# Patient Record
Sex: Male | Born: 1970 | Hispanic: No | Marital: Married | Smoking: Current every day smoker
Health system: Southern US, Community
[De-identification: ages and names within clinical notes are randomized; demographics above are authoritative.]

## PROBLEM LIST (undated history)

## (undated) ENCOUNTER — Ambulatory Visit: Discharge status: Absent without leave | Payer: Self-pay

## (undated) DIAGNOSIS — A048 Other specified bacterial intestinal infections: Secondary | ICD-10-CM

## (undated) DIAGNOSIS — Z8711 Personal history of peptic ulcer disease: Secondary | ICD-10-CM

## (undated) HISTORY — PX: TRACHEOSTOMY: SUR1362

## (undated) HISTORY — PX: GASTROSTOMY W/ FEEDING TUBE: SUR642

## (undated) HISTORY — PX: OTHER SURGICAL HISTORY: SHX169

---

## 2005-09-29 ENCOUNTER — Ambulatory Visit: Payer: Self-pay | Admitting: Pulmonary Disease

## 2005-09-29 ENCOUNTER — Inpatient Hospital Stay (HOSPITAL_COMMUNITY): Admission: AC | Admit: 2005-09-29 | Discharge: 2005-10-12 | Payer: Self-pay

## 2005-10-18 ENCOUNTER — Ambulatory Visit (HOSPITAL_COMMUNITY): Admission: RE | Admit: 2005-10-18 | Discharge: 2005-10-18 | Payer: Self-pay | Admitting: General Surgery

## 2005-12-14 ENCOUNTER — Ambulatory Visit (HOSPITAL_COMMUNITY): Admission: RE | Admit: 2005-12-14 | Discharge: 2005-12-14 | Payer: Self-pay | Admitting: Otolaryngology

## 2005-12-15 ENCOUNTER — Emergency Department (HOSPITAL_COMMUNITY): Admission: EM | Admit: 2005-12-15 | Discharge: 2005-12-15 | Payer: Self-pay | Admitting: Emergency Medicine

## 2005-12-26 ENCOUNTER — Encounter: Admission: RE | Admit: 2005-12-26 | Discharge: 2006-01-29 | Payer: Self-pay | Admitting: Otolaryngology

## 2006-02-02 ENCOUNTER — Ambulatory Visit (HOSPITAL_COMMUNITY): Admission: RE | Admit: 2006-02-02 | Discharge: 2006-02-02 | Payer: Self-pay | Admitting: Otolaryngology

## 2006-09-12 ENCOUNTER — Ambulatory Visit (HOSPITAL_COMMUNITY): Admission: RE | Admit: 2006-09-12 | Discharge: 2006-09-12 | Payer: Self-pay | Admitting: Otolaryngology

## 2007-08-28 ENCOUNTER — Encounter: Admission: RE | Admit: 2007-08-28 | Discharge: 2007-09-23 | Payer: Self-pay | Admitting: Otolaryngology

## 2010-05-31 ENCOUNTER — Emergency Department (HOSPITAL_COMMUNITY)
Admission: EM | Admit: 2010-05-31 | Discharge: 2010-05-31 | Payer: Self-pay | Source: Home / Self Care | Admitting: Family Medicine
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
## 2013-04-07 ENCOUNTER — Emergency Department (INDEPENDENT_AMBULATORY_CARE_PROVIDER_SITE_OTHER): Payer: Medicare Other

## 2013-04-07 ENCOUNTER — Encounter (HOSPITAL_COMMUNITY): Payer: Self-pay | Admitting: Emergency Medicine

## 2013-04-07 ENCOUNTER — Emergency Department (INDEPENDENT_AMBULATORY_CARE_PROVIDER_SITE_OTHER)
Admission: EM | Admit: 2013-04-07 | Discharge: 2013-04-07 | Disposition: A | Discharge status: Home / Self Care | Payer: Medicare Other | Source: Home / Self Care | Attending: Emergency Medicine | Admitting: Emergency Medicine

## 2013-04-07 DIAGNOSIS — A048 Other specified bacterial intestinal infections: Secondary | ICD-10-CM

## 2013-04-07 DIAGNOSIS — R109 Unspecified abdominal pain: Secondary | ICD-10-CM

## 2013-04-07 LAB — POCT URINALYSIS DIP (DEVICE)
Bilirubin Urine: NEGATIVE
Glucose, UA: NEGATIVE mg/dL
Hgb urine dipstick: NEGATIVE
Leukocytes, UA: NEGATIVE
Protein, ur: NEGATIVE mg/dL
Specific Gravity, Urine: 1.02 (ref 1.005–1.030)
Urobilinogen, UA: 0.2 mg/dL (ref 0.0–1.0)
pH: 7.5 (ref 5.0–8.0)

## 2013-04-07 LAB — POCT H PYLORI SCREEN: H. PYLORI SCREEN, POC: POSITIVE — AB

## 2013-04-07 MED ORDER — AMOXICILLIN 500 MG PO TABS: 1000.0000 mg | ORAL_TABLET | Freq: Two times a day (BID) | ORAL | Status: DC

## 2013-04-07 MED ORDER — CLARITHROMYCIN 500 MG PO TABS: 500.0000 mg | ORAL_TABLET | Freq: Two times a day (BID) | ORAL | Status: DC

## 2013-04-07 MED ORDER — OMEPRAZOLE 20 MG PO CPDR: 20.0000 mg | DELAYED_RELEASE_CAPSULE | Freq: Two times a day (BID) | ORAL | Status: DC

## 2013-04-07 NOTE — ED Notes (Signed)
C/o 3 day duration of pain epigastric and suprapubic area ; denies n/v/d, no changes in stool, no one else in home ill. ; NAD at present

## 2013-04-07 NOTE — ED Provider Notes (Signed)
CSN: [id number]     Arrival date & time 04/07/13  1433 History   First MD Initiated Contact with Patient 04/07/13 1744     Chief Complaint  Patient presents with  . Abdominal Pain   (Consider location/radiation/quality/duration/timing/severity/associated sxs/prior Treatment) HPI Comments: Pt reports that every time he eats, he feels pain in the epigastric area, and then he feels stool traveling through his abd and then he feels pain in the suprapubic area, then he has a bowel movement and the sx are completely resolved. He also feels similar sx when he is passing gas.  If he doesn't eat, he doesn't have bowel movement, but every time he eats he has a bowel movement. The stool is sometimes loose and sometimes solid. No pain at the present.   Patient is a 42 y.o. male presenting with abdominal pain. The history is provided by the patient.  Abdominal Pain This is a new problem. Episode onset: 3 days ago. Associated symptoms include abdominal pain. The symptoms are aggravated by eating. Relieved by: bowel movement or passing gas. Treatments tried: eating only vegetables or not eating. The treatment provided moderate relief.    History reviewed. No pertinent past medical history. Past Surgical History  Procedure Laterality Date  . Gastrostomy w/ feeding tube    . Tracheostomy     History reviewed. No pertinent family history. History  Substance Use Topics  . Smoking status: Never Smoker   . Smokeless tobacco: Not on file  . Alcohol Use: No    Review of Systems  Constitutional: Negative for fever and chills.  Gastrointestinal: Positive for abdominal pain. Negative for nausea, vomiting and blood in stool.  Genitourinary: Negative for dysuria and enuresis.    Allergies  Review of patient's allergies indicates no known allergies.  Home Medications   Current Outpatient Rx  Name  Route  Sig  Dispense  Refill  . amoxicillin (AMOXIL) 500 MG tablet   Oral   Take 2 tablets (1,000 mg  total) by mouth 2 (two) times daily.   56 tablet   0   . clarithromycin (BIAXIN) 500 MG tablet   Oral   Take 1 tablet (500 mg total) by mouth 2 (two) times daily.   28 tablet   0   . omeprazole (PRILOSEC) 20 MG capsule   Oral   Take 1 capsule (20 mg total) by mouth 2 (two) times daily.   28 capsule   0    BP 123/85  Pulse 77  Temp(Src) 98.2 F (36.8 C) (Oral)  Resp 16  SpO2 96% Physical Exam  Constitutional: He appears well-developed and well-nourished. No distress.  Cardiovascular: Normal rate and regular rhythm.   Pulmonary/Chest: Effort normal and breath sounds normal.  Abdominal: Normal appearance and bowel sounds are normal. There is no tenderness. There is no rigidity, no rebound and no CVA tenderness.  Scar in epigastric area- pt reports is from gastrostomy tube.     ED Course  Procedures (including critical care time) Labs Review Labs Reviewed  POCT H PYLORI SCREEN - Abnormal; Notable for the following:    H. PYLORI SCREEN, POC POSITIVE (*)    All other components within normal limits  POCT URINALYSIS DIP (DEVICE)   Imaging Review Dg Abd Acute W/chest  04/07/2013   CLINICAL DATA:  Abdominal pain and diarrhea  EXAM: ACUTE ABDOMEN SERIES (ABDOMEN 2 VIEW & CHEST 1 VIEW)  COMPARISON:  Chest radiograph February 02, 2006  FINDINGS: PA chest: There is bibasilar atelectasis. Lungs are  otherwise clear. Heart size and pulmonary vascularity are normal. No adenopathy.  Supine and upright abdomen: There is moderate stool throughout colon. The bowel gas pattern is normal. No obstruction or free air. There are phleboliths in the pelvis.  IMPRESSION: Unremarkable bowel gas pattern. Mild bibasilar atelectasis. No edema or consolidation.   Electronically Signed   By: Bretta Bang M.D.   On: 04/07/2013 17:11    EKG Interpretation     Ventricular Rate:    PR Interval:    QRS Duration:   QT Interval:    QTC Calculation:   R Axis:     Text Interpretation:               MDM   1. Abdominal pain   2. H. pylori infection    Uncertain of exact cause of pt's sx.  Will tx for h pylori. Rx amoxicillin 500mg  2 tabs po BID #56, clarithromycin 500mg  BID #28, and omeprazole 20mg  BID #28. Pt to change diet to clear liquids for 2-3 days and then switch to brat diet for 2-3 days. If this tx plan does not improve sx, pt to f/u with [facility].     Cathlyn Parsons, NP 04/07/13 [id number]

## 2013-04-07 NOTE — ED Provider Notes (Signed)
Medical screening examination/treatment/procedure(s) were performed by non-physician practitioner and as supervising physician I was immediately available for consultation/collaboration.  Leslee Home, M.D.  Reuben Likes, MD 04/07/13 2123

## 2015-08-11 ENCOUNTER — Encounter (HOSPITAL_COMMUNITY): Payer: Self-pay | Admitting: Vascular Surgery

## 2015-08-11 ENCOUNTER — Emergency Department (HOSPITAL_COMMUNITY)
Admission: EM | Admit: 2015-08-11 | Discharge: 2015-08-11 | Disposition: A | Discharge status: Home / Self Care | Payer: Medicare Other | Attending: Emergency Medicine | Admitting: Emergency Medicine

## 2015-08-11 DIAGNOSIS — R202 Paresthesia of skin: Secondary | ICD-10-CM | POA: Insufficient documentation

## 2015-08-11 DIAGNOSIS — M542 Cervicalgia: Secondary | ICD-10-CM | POA: Diagnosis not present

## 2015-08-11 DIAGNOSIS — R200 Anesthesia of skin: Secondary | ICD-10-CM | POA: Diagnosis not present

## 2015-08-11 DIAGNOSIS — M25511 Pain in right shoulder: Secondary | ICD-10-CM | POA: Diagnosis present

## 2015-08-11 DIAGNOSIS — Z792 Long term (current) use of antibiotics: Secondary | ICD-10-CM | POA: Diagnosis not present

## 2015-08-11 DIAGNOSIS — Z8619 Personal history of other infectious and parasitic diseases: Secondary | ICD-10-CM | POA: Diagnosis not present

## 2015-08-11 DIAGNOSIS — Z79899 Other long term (current) drug therapy: Secondary | ICD-10-CM | POA: Insufficient documentation

## 2015-08-11 HISTORY — DX: Other specified bacterial intestinal infections: A04.8
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
## 2015-08-11 NOTE — Discharge Instructions (Signed)
Shoulder Pain Follow up with orthopedics. Take ibuprofen as needed for pain. You may need conservative treatment such as physical therapy or joint injection. The shoulder is the joint that connects your arm to your body. Muscles and band-like tissues that connect bones to muscles (tendons) hold the joint together. Shoulder pain is felt if an injury or medical problem affects one or more parts of the shoulder. HOME CARE   Put ice on the sore area.  Put ice in a plastic bag.  Place a towel between your skin and the bag.  Leave the ice on for 15-20 minutes, 03-04 times a day for the first 2 days.  Stop using cold packs if they do not help with the pain.  If you were given something to keep your shoulder from moving (sling; shoulder immobilizer), wear it as told. Only take it off to shower or bathe.  Move your arm as little as possible, but keep your hand moving to prevent puffiness (swelling).  Squeeze a soft ball or foam pad as much as possible to help prevent swelling.  Take medicine as told by your doctor. GET HELP IF:  You have progressing new pain in your arm, hand, or fingers.  Your hand or fingers get cold.  Your medicine does not help lessen your pain. GET HELP RIGHT AWAY IF:   Your arm, hand, or fingers are numb or tingling.  Your arm, hand, or fingers are puffy (swollen), painful, or turn white or blue. MAKE SURE YOU:   Understand these instructions.  Will watch your condition.  Will get help right away if you are not doing well or get worse.   This information is not intended to replace advice given to you by your health care provider. Make sure you discuss any questions you have with your health care provider.   Document Released: 11/29/2007 Document Revised: 07/03/2014 Document Reviewed: 10/05/2014 Elsevier Interactive Patient Education [employer]

## 2015-08-11 NOTE — ED Provider Notes (Signed)
CSN: [id number]     Arrival date & time 08/11/15  1629 History  By signing my name below, I, Linna Darner, attest that this documentation has been prepared under the direction and in the presence of non-physician practitioner, Haynes Dage, PA-C. Electronically Signed: Linna Darner, Scribe. 08/11/2015. 4:38 PM.    Chief Complaint  Patient presents with  . Shoulder Pain    The history is provided by the patient. No language interpreter was used.     HPI Comments: [name] is a 45 y.o. male who presents to the Emergency Department complaining of sudden onset, constant, right shoulder pain for the last week. Pt states that the pain occasionally radiates throughout his right arm; he also endorses intermittent tingling and numbness in his right hand. Pt endorses pain to deep palpation to back of right shoulder. He states that his pain began in his right shoulder but endorses associated occasional, moderate neck pain as well. Pt denies any known injury. Pt denies taking any medications for his pain. He has never had surgery on his right shoulder. He denies any known medical issues.  Pt denies weakness or any other associated symptoms at this time.    Past Medical History  Diagnosis Date  . H. pylori infection    Past Surgical History  Procedure Laterality Date  . Gastrostomy w/ feeding tube    . Tracheostomy    . Vocal cord repair     No family history on file. Social History  Substance Use Topics  . Smoking status: Never Smoker   . Smokeless tobacco: None  . Alcohol Use: No    Review of Systems  Musculoskeletal: Positive for arthralgias (right shoulder; right arm) and neck pain (back of neck, moderate).  Neurological: Positive for numbness (right hand). Negative for weakness.      Allergies  Review of patient's allergies indicates no known allergies.  Home Medications   Prior to Admission medications   Medication Sig Start Date End Date Taking? Authorizing  Provider  amoxicillin (AMOXIL) 500 MG tablet Take 2 tablets (1,000 mg total) by mouth 2 (two) times daily. 04/07/13   Cathlyn Parsons, NP  clarithromycin (BIAXIN) 500 MG tablet Take 1 tablet (500 mg total) by mouth 2 (two) times daily. 04/07/13   Cathlyn Parsons, NP  omeprazole (PRILOSEC) 20 MG capsule Take 1 capsule (20 mg total) by mouth 2 (two) times daily. 04/07/13   Cathlyn Parsons, NP   BP 119/80 mmHg  Pulse 86  Temp(Src) 97.9 F (36.6 C) (Oral)  Resp 16  SpO2 100% Physical Exam  Constitutional: He is oriented to person, place, and time. He appears well-developed and well-nourished. No distress.  HENT:  Head: Normocephalic and atraumatic.  Eyes: Conjunctivae and EOM are normal.  Neck: Normal range of motion. Neck supple. No spinous process tenderness present. No tracheal deviation present.  Neck is supple. Normal range of motion without pain. No spinous process tenderness.  Pain just above the right scapula but no erythema or ecchymosis.  Cardiovascular: Normal rate.   Pulmonary/Chest: Effort normal. No respiratory distress.  Musculoskeletal: Normal range of motion.  Right arm: Full range of motion without difficulty. 2+ radial pulse. Able to flex and extend all fingers. Able to flex and extend elbow. Reports shooting pain down his arm and into his fingers. No numbness at this time.  Neurological: He is alert and oriented to person, place, and time.  Skin: Skin is warm and dry.  Psychiatric: He has a normal mood  and affect. His behavior is normal.  Nursing note and vitals reviewed.   ED Course  Procedures (including critical care time)  DIAGNOSTIC STUDIES: Oxygen Saturation is 100% on RA, normal by my interpretation.    COORDINATION OF CARE: 4:38 PM Discussed treatment plan with pt at bedside and pt agreed to plan.   Labs Review Labs Reviewed - No data to display  Imaging Review No results found.    EKG Interpretation None      MDM   Final diagnoses:  Right  shoulder pain   Patient reports numbness and tingling down his right arm with pain along the scapula. He also reports a shooting pain that originates from his back to his arm. I believe this is nerve related and not an issue with the bones. No signs of infection or septic joint. He is afebrile with stable vitals. I discussed follow-up with orthopedics for possible conservative therapies. Patient refused pain medication and stated that he just wanted to make sure everything was okay. Medications - No data to display Filed Vitals:   08/11/15 1635  BP: 119/80  Pulse: 86  Temp: 97.9 F (36.6 C)  Resp: 16   I personally performed the services described in this documentation, which was scribed in my presence. The recorded information has been reviewed and is accurate.      Catha Gosselin, PA-C 08/11/15 1652  Donnetta Hutching, MD 08/11/15 2308

## 2018-06-26 ENCOUNTER — Other Ambulatory Visit: Payer: Self-pay

## 2018-06-26 ENCOUNTER — Emergency Department (HOSPITAL_COMMUNITY)
Admission: EM | Admit: 2018-06-26 | Discharge: 2018-06-26 | Disposition: A | Discharge status: Home / Self Care | Payer: Medicare Other | Attending: Emergency Medicine | Admitting: Emergency Medicine

## 2018-06-26 ENCOUNTER — Other Ambulatory Visit (HOSPITAL_COMMUNITY): Payer: Medicare Other

## 2018-06-26 ENCOUNTER — Emergency Department (HOSPITAL_COMMUNITY): Payer: Medicare Other

## 2018-06-26 DIAGNOSIS — K8020 Calculus of gallbladder without cholecystitis without obstruction: Secondary | ICD-10-CM | POA: Diagnosis not present

## 2018-06-26 DIAGNOSIS — Z79899 Other long term (current) drug therapy: Secondary | ICD-10-CM | POA: Insufficient documentation

## 2018-06-26 DIAGNOSIS — R1011 Right upper quadrant pain: Secondary | ICD-10-CM | POA: Insufficient documentation

## 2018-06-26 LAB — COMPREHENSIVE METABOLIC PANEL
ALK PHOS: 45 U/L (ref 38–126)
ALT: 34 U/L (ref 0–44)
AST: 37 U/L (ref 15–41)
Albumin: 3.8 g/dL (ref 3.5–5.0)
Anion gap: 9 (ref 5–15)
BUN: 13 mg/dL (ref 6–20)
CO2: 29 mmol/L (ref 22–32)
CREATININE: 0.96 mg/dL (ref 0.61–1.24)
Calcium: 8.7 mg/dL — ABNORMAL LOW (ref 8.9–10.3)
Chloride: 106 mmol/L (ref 98–111)
GFR calc Af Amer: >60 mL/min (ref >60)
GFR calc non Af Amer: >60 mL/min (ref >60)
Glucose, Bld: 142 mg/dL — ABNORMAL HIGH (ref 70–99)
Potassium: 4 mmol/L (ref 3.5–5.1)
Sodium: 144 mmol/L (ref 135–145)
Total Bilirubin: 1.1 mg/dL (ref 0.3–1.2)
Total Protein: 6.8 g/dL (ref 6.5–8.1)

## 2018-06-26 LAB — CBC WITH DIFFERENTIAL/PLATELET
Abs Immature Granulocytes: 0.03 10*3/uL (ref 0.00–0.07)
Basophils Absolute: 0 10*3/uL (ref 0.0–0.1)
Basophils Relative: 1 %
Eosinophils Absolute: 0.1 10*3/uL (ref 0.0–0.5)
Eosinophils Relative: 2 %
HCT: 50.8 % (ref 39.0–52.0)
HEMOGLOBIN: 16.4 g/dL (ref 13.0–17.0)
Immature Granulocytes: 1 %
LYMPHS PCT: 31 %
Lymphs Abs: 1.5 10*3/uL (ref 0.7–4.0)
MCH: 29.1 pg (ref 26.0–34.0)
MCHC: 32.3 g/dL (ref 30.0–36.0)
MCV: 90.1 fL (ref 80.0–100.0)
Monocytes Absolute: 0.6 10*3/uL (ref 0.1–1.0)
Monocytes Relative: 12 %
Neutro Abs: 2.6 10*3/uL (ref 1.7–7.7)
Neutrophils Relative %: 53 %
Platelets: 278 10*3/uL (ref 150–400)
RBC: 5.64 MIL/uL (ref 4.22–5.81)
RDW: 12.9 % (ref 11.5–15.5)
WBC: 4.8 10*3/uL (ref 4.0–10.5)
nRBC: 0 % (ref 0.0–0.2)

## 2018-06-26 LAB — URINALYSIS, ROUTINE W REFLEX MICROSCOPIC
Bilirubin Urine: NEGATIVE
Glucose, UA: NEGATIVE mg/dL
Hgb urine dipstick: NEGATIVE
Ketones, ur: NEGATIVE mg/dL
Leukocytes, UA: NEGATIVE
Nitrite: NEGATIVE
Protein, ur: NEGATIVE mg/dL
Specific Gravity, Urine: 1.031 — ABNORMAL HIGH (ref 1.005–1.030)
pH: 5 (ref 5.0–8.0)

## 2018-06-26 LAB — RAPID URINE DRUG SCREEN, HOSP PERFORMED
Amphetamines: NOT DETECTED
Barbiturates: NOT DETECTED
Benzodiazepines: NOT DETECTED
Cocaine: NOT DETECTED
Opiates: NOT DETECTED
TETRAHYDROCANNABINOL: NOT DETECTED

## 2018-06-26 LAB — LIPASE, BLOOD: Lipase: 24 U/L (ref 11–51)

## 2018-06-26 MED ORDER — HYDROCODONE-ACETAMINOPHEN 5-325 MG PO TABS: 1.0000 | ORAL_TABLET | ORAL | 0 refills | Status: DC | PRN

## 2018-06-26 MED ORDER — HYDROCODONE-ACETAMINOPHEN 5-325 MG PO TABS
1.0000 | ORAL_TABLET | Freq: Once | ORAL | Status: AC
  Administered 2018-06-26: 1 via ORAL
  Filled 2018-06-26: qty 1

## 2018-06-26 NOTE — ED Provider Notes (Signed)
[facility] EMERGENCY DEPARTMENT Provider Note   CSN: [id number] Arrival date & time: 06/26/18  0830     History   Chief Complaint Chief Complaint  Patient presents with  . Abdominal Pain    HPI [name] is a 48 y.o. male.  HPI   He reports onset of upper abdominal pain, somewhat right-sided, this morning.  He wonders if it is related to his intake of "lentils," last night.  He also feels like he has "gas."  He denies nausea, vomiting, weakness or dizziness.  There is been no fever, chest pain or shortness of breath.  No prior similar problems.  There are no modifying factors.  Past Medical History:  Diagnosis Date  . H. pylori infection     There are no active problems to display for this patient.   Past Surgical History:  Procedure Laterality Date  . GASTROSTOMY W/ FEEDING TUBE    . TRACHEOSTOMY    . vocal cord repair          Home Medications    Prior to Admission medications   Medication Sig Start Date End Date Taking? Authorizing Provider  amoxicillin (AMOXIL) 500 MG tablet Take 2 tablets (1,000 mg total) by mouth 2 (two) times daily. 04/07/13   Cathlyn ParsonsKabbe, Angela M, NP  clarithromycin (BIAXIN) 500 MG tablet Take 1 tablet (500 mg total) by mouth 2 (two) times daily. 04/07/13   Cathlyn ParsonsKabbe, Angela M, NP  HYDROcodone-acetaminophen (NORCO) 5-325 MG tablet Take 1-2 tablets by mouth every 4 (four) hours as needed for moderate pain. 06/26/18   Mancel BaleWentz, Kassidie Hendriks, MD  omeprazole (PRILOSEC) 20 MG capsule Take 1 capsule (20 mg total) by mouth 2 (two) times daily. 04/07/13   Cathlyn ParsonsKabbe, Angela M, NP    Family History No family history on file.  Social History Social History   Tobacco Use  . Smoking status: Never Smoker  Substance Use Topics  . Alcohol use: No  . Drug use: Not on file     Allergies   Patient has no known allergies.   Review of Systems Review of Systems  All other systems reviewed and are negative.    Physical Exam Updated Vital  Signs BP (!) 141/95   Pulse 87   Temp 97.7 F (36.5 C) (Oral)   Resp 16   Ht 5\' 11"  (1.803 m)   Wt 77.1 kg   SpO2 99%   BMI 23.71 kg/m   Physical Exam Vitals signs and nursing note reviewed.  Constitutional:      Appearance: He is well-developed.  HENT:     Head: Normocephalic and atraumatic.     Right Ear: External ear normal.     Left Ear: External ear normal.  Eyes:     Conjunctiva/sclera: Conjunctivae normal.     Pupils: Pupils are equal, round, and reactive to light.  Neck:     Musculoskeletal: Normal range of motion and neck supple.     Trachea: Phonation normal.  Cardiovascular:     Rate and Rhythm: Normal rate and regular rhythm.     Heart sounds: Normal heart sounds.  Pulmonary:     Effort: Pulmonary effort is normal.     Breath sounds: Normal breath sounds.  Abdominal:     Palpations: Abdomen is soft.     Tenderness: There is no abdominal tenderness.  Musculoskeletal: Normal range of motion.  Skin:    General: Skin is warm and dry.  Neurological:     Mental Status: He is  alert and oriented to person, place, and time.     Cranial Nerves: No cranial nerve deficit.     Sensory: No sensory deficit.     Motor: No abnormal muscle tone.     Coordination: Coordination normal.  Psychiatric:        Behavior: Behavior normal.        Thought Content: Thought content normal.        Judgment: Judgment normal.      ED Treatments / Results  Labs (all labs ordered are listed, but only abnormal results are displayed) Labs Reviewed  COMPREHENSIVE METABOLIC PANEL - Abnormal; Notable for the following components:      Result Value   Glucose, Bld 142 (*)    Calcium 8.7 (*)    All other components within normal limits  URINALYSIS, ROUTINE W REFLEX MICROSCOPIC - Abnormal; Notable for the following components:   APPearance HAZY (*)    Specific Gravity, Urine 1.031 (*)    All other components within normal limits  LIPASE, BLOOD  CBC WITH DIFFERENTIAL/PLATELET  RAPID  URINE DRUG SCREEN, HOSP PERFORMED    EKG None  Radiology US Abdomen Complete  Result Date: 06/26/2018 CLINICAL DATA:  Right upper quadrant abdominal pain. EXAM: ABDOMEN ULTRASOUND COMPLETE COMPARISON:  None. FINDINGS: Gallbladder: Multiple stones within the gallbladder lumen. No gallbladder wall thickening or pericholecystic fluid. Negative sonographic Murphy sign. Common bile duct: Diameter: 5 mm Liver: Increased in echogenicity. No focal lesion identified. Portal vein is patent on color Doppler imaging with normal direction of blood flow towards the liver. IVC: No abnormality visualized. Pancreas: Visualized portion unremarkable. Spleen: Size and appearance within normal limits. Right Kidney: Length: 9.9 cm. Increased cortical echogenicity. No hydronephrosis. Left Kidney: Length: 10.6 cm. Increased cortical echogenicity. No hydronephrosis. Abdominal aorta: No aneurysm visualized. Other findings: None. IMPRESSION: 1. Cholelithiasis without sonographic evidence for acute cholecystitis. 2. Hepatic steatosis. 3. Echogenic kidneys suggestive of chronic medical renal disease. Electronically Signed   By: Annia Belt M.D.   On: 06/26/2018 10:46    Procedures Procedures (including critical care time)  Medications Ordered in ED Medications  HYDROcodone-acetaminophen (NORCO/VICODIN) 5-325 MG per tablet 1 tablet (has no administration in time range)     Initial Impression / Assessment and Plan / ED Course  I have reviewed the triage vital signs and the nursing notes.  Pertinent labs & imaging results that were available during my care of the patient were reviewed by me and considered in my medical decision making (see chart for details).  Clinical Course as of Jun 26 1209  Wed Jun 26, 2018  1105 Normal  Urine rapid drug screen (hosp performed) [EW]  1105 Normal except glucose high, calcium low  Comprehensive metabolic panel(!) [EW]  1105 Normal   [EW]  1105 Normal except specific gravity high    Urinalysis, Routine w reflex microscopic(!) [EW]  1105 Gallstones without cholecystitis.  US Abdomen Complete [EW]    Clinical Course User Index [EW] Mancel Bale, MD     Patient Vitals for the past 24 hrs:  BP Temp Temp src Pulse Resp SpO2 Height Weight  06/26/18 1130 (!) 141/95 - - 87 16 99 % - -  06/26/18 1052 114/74 - - 67 18 100 % - -  06/26/18 0945 (!) 137/97 - - 71 - 100 % - -  06/26/18 0930 (!) 127/93 - - 86 - 98 % - -  06/26/18 0900 111/82 - - 81 - 100 % - -  06/26/18 0845 125/84 - -  80 - 98 % - -  06/26/18 0842 124/89 97.7 F (36.5 C) Oral 71 16 97 % - -  06/26/18 0839 - - - - - - 5\' 11"  (1.803 m) 77.1 kg    12:03 AM Reevaluation with update and discussion. After initial assessment and treatment, an updated evaluation reveals he is somewhat uncomfortable at this time and requested a pain pill.  Findings discussed with the patient and his wife, all questions were answered. Mancel Bale   Medical Decision Making: Abdominal pain with gallstones present.  Suspect gallbladder colic causing discomfort.  No evidence for cholecystitis.  Patient can be managed as an outpatient pending surgical consultation.  Doubt serious bacterial infection, metabolic instability or impending vascular collapse.  CRITICAL CARE-no Performed by: Mancel Bale  Nursing Notes Reviewed/ Care Coordinated Applicable Imaging Reviewed Interpretation of Laboratory Data incorporated into ED treatment  The patient appears reasonably screened and/or stabilized for discharge and I doubt any other medical condition or other [facility] requiring further screening, evaluation, or treatment in the ED at this time prior to discharge.  Plan: Home Medications-OTC analgesia of choice; Home Treatments-low-fat diet; return here if the recommended treatment, does not improve the symptoms; Recommended follow up-General surgery follow-up regarding cholecystectomy if needed.    Final Clinical Impressions(s) / ED Diagnoses    Final diagnoses:  Right upper quadrant abdominal pain  Gall bladder stones    ED Discharge Orders         Ordered    HYDROcodone-acetaminophen (NORCO) 5-325 MG tablet  Every 4 hours PRN     06/26/18 1208           Mancel Bale, MD 06/26/18 1211

## 2018-06-26 NOTE — ED Triage Notes (Signed)
Pt endorses RUQ abdominal pain starting this am at 0500. Denies N/V/D. Sts he feels like he needs to burp.

## 2018-06-26 NOTE — Discharge Instructions (Addendum)
The pain which you are having seems to be caused by the stones in your gallbladder.  To help minimize discomfort, try to avoid all forms of fat.  See the attached directions regarding low-fat diet.  If you continue to have discomfort you will likely need to have your gallbladder removed.  We are referring you to a surgeon to see for that.  Call them and schedule appointment if you continue to have pain.  Return here if you cannot control your pain with the medicines given, and a low-fat diet.  You can also try using Tylenol or Motrin for pain.

## 2018-09-30 ENCOUNTER — Other Ambulatory Visit: Payer: Self-pay

## 2018-09-30 ENCOUNTER — Encounter (HOSPITAL_COMMUNITY): Payer: Self-pay

## 2018-09-30 ENCOUNTER — Inpatient Hospital Stay (HOSPITAL_COMMUNITY)
Admission: EM | Admit: 2018-09-30 | Discharge: 2018-10-02 | DRG: 418 | Disposition: A | Discharge status: Home / Self Care | Payer: Medicare Other | Attending: General Surgery | Admitting: General Surgery

## 2018-09-30 ENCOUNTER — Emergency Department (HOSPITAL_COMMUNITY): Payer: Medicare Other

## 2018-09-30 DIAGNOSIS — K8012 Calculus of gallbladder with acute and chronic cholecystitis without obstruction: Secondary | ICD-10-CM | POA: Diagnosis present

## 2018-09-30 DIAGNOSIS — R7989 Other specified abnormal findings of blood chemistry: Secondary | ICD-10-CM | POA: Diagnosis present

## 2018-09-30 DIAGNOSIS — K8021 Calculus of gallbladder without cholecystitis with obstruction: Secondary | ICD-10-CM

## 2018-09-30 DIAGNOSIS — J3801 Paralysis of vocal cords and larynx, unilateral: Secondary | ICD-10-CM | POA: Diagnosis present

## 2018-09-30 DIAGNOSIS — K851 Biliary acute pancreatitis without necrosis or infection: Principal | ICD-10-CM | POA: Diagnosis present

## 2018-09-30 DIAGNOSIS — R1011 Right upper quadrant pain: Secondary | ICD-10-CM

## 2018-09-30 LAB — CBC WITH DIFFERENTIAL/PLATELET
Abs Immature Granulocytes: 0.02 10*3/uL (ref 0.00–0.07)
Basophils Absolute: 0 10*3/uL (ref 0.0–0.1)
Basophils Relative: 0 %
Eosinophils Absolute: 0 10*3/uL (ref 0.0–0.5)
Eosinophils Relative: 1 %
HCT: 48 % (ref 39.0–52.0)
Hemoglobin: 15.7 g/dL (ref 13.0–17.0)
Immature Granulocytes: 0 %
Lymphocytes Relative: 15 %
Lymphs Abs: 0.7 10*3/uL (ref 0.7–4.0)
MCH: 28.4 pg (ref 26.0–34.0)
MCHC: 32.7 g/dL (ref 30.0–36.0)
MCV: 87 fL (ref 80.0–100.0)
Monocytes Absolute: 0.5 10*3/uL (ref 0.1–1.0)
Monocytes Relative: 12 %
Neutro Abs: 3.2 10*3/uL (ref 1.7–7.7)
Neutrophils Relative %: 72 %
Platelets: 241 10*3/uL (ref 150–400)
RBC: 5.52 MIL/uL (ref 4.22–5.81)
RDW: 12.8 % (ref 11.5–15.5)
WBC: 4.5 10*3/uL (ref 4.0–10.5)
nRBC: 0 % (ref 0.0–0.2)

## 2018-09-30 LAB — CBC
HCT: 45.8 % (ref 39.0–52.0)
Hemoglobin: 15.7 g/dL (ref 13.0–17.0)
MCH: 30.2 pg (ref 26.0–34.0)
MCHC: 34.3 g/dL (ref 30.0–36.0)
MCV: 88.1 fL (ref 80.0–100.0)
Platelets: 280 10*3/uL (ref 150–400)
RBC: 5.2 MIL/uL (ref 4.22–5.81)
RDW: 13.1 % (ref 11.5–15.5)
WBC: 5.6 10*3/uL (ref 4.0–10.5)
nRBC: 0 % (ref 0.0–0.2)

## 2018-09-30 LAB — COMPREHENSIVE METABOLIC PANEL
ALT: 479 U/L — ABNORMAL HIGH (ref 0–44)
AST: 291 U/L — ABNORMAL HIGH (ref 15–41)
Albumin: 3.7 g/dL (ref 3.5–5.0)
Alkaline Phosphatase: 101 U/L (ref 38–126)
Anion gap: 11 (ref 5–15)
BUN: 9 mg/dL (ref 6–20)
CO2: 23 mmol/L (ref 22–32)
Calcium: 9 mg/dL (ref 8.9–10.3)
Chloride: 101 mmol/L (ref 98–111)
Creatinine, Ser: 0.81 mg/dL (ref 0.61–1.24)
GFR calc Af Amer: >60 mL/min (ref >60)
GFR calc non Af Amer: >60 mL/min (ref >60)
Glucose, Bld: 82 mg/dL (ref 70–99)
Potassium: 3.6 mmol/L (ref 3.5–5.1)
Sodium: 135 mmol/L (ref 135–145)
Total Bilirubin: 6 mg/dL — ABNORMAL HIGH (ref 0.3–1.2)
Total Protein: 6.7 g/dL (ref 6.5–8.1)

## 2018-09-30 LAB — LIPASE, BLOOD: Lipase: 676 U/L — ABNORMAL HIGH (ref 11–51)

## 2018-09-30 LAB — TRIGLYCERIDES: Triglycerides: 62 mg/dL (ref <150)

## 2018-09-30 LAB — CREATININE, SERUM
Creatinine, Ser: 0.83 mg/dL (ref 0.61–1.24)
GFR calc Af Amer: >60 mL/min (ref >60)
GFR calc non Af Amer: >60 mL/min (ref >60)

## 2018-09-30 MED ORDER — KETOROLAC TROMETHAMINE 30 MG/ML IJ SOLN: 30.0000 mg | Freq: Four times a day (QID) | INTRAMUSCULAR | Status: DC | PRN

## 2018-09-30 MED ORDER — ONDANSETRON HCL 4 MG/2ML IJ SOLN: 4.0000 mg | Freq: Four times a day (QID) | INTRAMUSCULAR | Status: DC | PRN

## 2018-09-30 MED ORDER — ACETAMINOPHEN 325 MG PO TABS
650.0000 mg | ORAL_TABLET | Freq: Four times a day (QID) | ORAL | Status: DC | PRN
  Administered 2018-10-01: 650 mg via ORAL
  Filled 2018-09-30: qty 2

## 2018-09-30 MED ORDER — ENOXAPARIN SODIUM 40 MG/0.4ML ~~LOC~~ SOLN
40.0000 mg | SUBCUTANEOUS | Status: DC
  Administered 2018-09-30 – 2018-10-01 (×2): 40 mg via SUBCUTANEOUS
  Filled 2018-09-30 (×2): qty 0.4

## 2018-09-30 MED ORDER — SODIUM CHLORIDE 0.9 % IV SOLN
Freq: Once | INTRAVENOUS | Status: AC
  Administered 2018-09-30: 100 mL/h via INTRAVENOUS

## 2018-09-30 MED ORDER — MORPHINE SULFATE (PF) 2 MG/ML IV SOLN: 1.0000 mg | INTRAVENOUS | Status: DC | PRN

## 2018-09-30 MED ORDER — DIPHENHYDRAMINE HCL 50 MG/ML IJ SOLN: 25.0000 mg | Freq: Four times a day (QID) | INTRAMUSCULAR | Status: DC | PRN

## 2018-09-30 MED ORDER — ONDANSETRON 4 MG PO TBDP: 4.0000 mg | ORAL_TABLET | Freq: Four times a day (QID) | ORAL | Status: DC | PRN

## 2018-09-30 MED ORDER — DIPHENHYDRAMINE HCL 25 MG PO CAPS: 25.0000 mg | ORAL_CAPSULE | Freq: Four times a day (QID) | ORAL | Status: DC | PRN

## 2018-09-30 MED ORDER — ACETAMINOPHEN 500 MG PO TABS
1000.0000 mg | ORAL_TABLET | Freq: Once | ORAL | Status: AC
  Administered 2018-09-30: 1000 mg via ORAL
  Filled 2018-09-30: qty 2

## 2018-09-30 MED ORDER — DOCUSATE SODIUM 100 MG PO CAPS
100.0000 mg | ORAL_CAPSULE | Freq: Two times a day (BID) | ORAL | Status: DC
  Administered 2018-09-30 – 2018-10-02 (×3): 100 mg via ORAL
  Filled 2018-09-30 (×3): qty 1

## 2018-09-30 MED ORDER — METOPROLOL TARTRATE 5 MG/5ML IV SOLN: 5.0000 mg | Freq: Four times a day (QID) | INTRAVENOUS | Status: DC | PRN

## 2018-09-30 MED ORDER — PANTOPRAZOLE SODIUM 40 MG IV SOLR
40.0000 mg | Freq: Every day | INTRAVENOUS | Status: DC
  Administered 2018-09-30 – 2018-10-01 (×2): 40 mg via INTRAVENOUS
  Filled 2018-09-30 (×2): qty 40

## 2018-09-30 MED ORDER — ACETAMINOPHEN 650 MG RE SUPP: 650.0000 mg | Freq: Four times a day (QID) | RECTAL | Status: DC | PRN

## 2018-09-30 MED ORDER — POTASSIUM CHLORIDE IN NACL 20-0.45 MEQ/L-% IV SOLN
INTRAVENOUS | Status: DC
  Administered 2018-09-30 – 2018-10-01 (×2): via INTRAVENOUS
  Filled 2018-09-30 (×3): qty 1000

## 2018-09-30 MED ORDER — OXYCODONE HCL 5 MG PO TABS
5.0000 mg | ORAL_TABLET | ORAL | Status: DC | PRN
  Administered 2018-10-01: 10 mg via ORAL
  Filled 2018-09-30: qty 2

## 2018-09-30 MED ORDER — SODIUM CHLORIDE 0.9 % IV SOLN
2.0000 g | Freq: Once | INTRAVENOUS | Status: AC
  Administered 2018-09-30: 2 g via INTRAVENOUS
  Filled 2018-09-30: qty 20

## 2018-09-30 NOTE — ED Triage Notes (Signed)
Pt here for RUQ pain fot the past 2 days, pt was here 3 months ago with similar symptoms and they said he has gallbladder problems. Denies n/v/d. Pt a.o, nad noted.

## 2018-09-30 NOTE — ED Notes (Signed)
ED TO INPATIENT HANDOFF REPORT  ED Nurse Name and Phone #: Aldean Jewett [phone]  S Name/Age/Gender [name] 48 y.o. male Room/Bed: 034C/034C  Code Status   Code Status: Full Code  Home/SNF/Other Home Patient oriented to: self, place, time and situation Is this baseline? Yes   Triage Complete: Triage complete  Chief Complaint abd pain  Triage Note Pt here for RUQ pain fot the past 2 days, pt was here 3 months ago with similar symptoms and they said he has gallbladder problems. Denies n/v/d. Pt a.o, nad noted.    Allergies No Known Allergies  Level of Care/Admitting Diagnosis ED Disposition    ED Disposition Condition Comment   Admit  Hospital Area: [facility] [[id number]]  Level of Care: Med-Surg [16]  Diagnosis: Gallstone pancreatitis [606770]  Admitting Physician: Andrey Campanile, ERIC [[id number]]  Attending Physician: CCS, MD [[id number]]  Estimated length of stay: past midnight tomorrow  Certification:: I certify this patient will need inpatient services for at least 2 midnights  Bed request comments: 6N  PT Class (Do Not Modify): Inpatient [101]  PT Acc Code (Do Not Modify): Private [1]       B Medical/Surgery History Past Medical History:  Diagnosis Date  . H. pylori infection    Past Surgical History:  Procedure Laterality Date  . GASTROSTOMY W/ FEEDING TUBE    . TRACHEOSTOMY    . vocal cord repair       A IV Location/Drains/Wounds Patient Lines/Drains/Airways Status   Active Line/Drains/Airways    Name:   Placement date:   Placement time:   Site:   Days:   Peripheral IV 09/30/18 Left Antecubital   09/30/18    1401    Antecubital   less than 1          Intake/Output Last 24 hours No intake or output data in the 24 hours ending 09/30/18 1504  Labs/Imaging Results for orders placed or performed during the hospital encounter of 09/30/18 (from the past 48 hour(s))  CBC with Differential     Status: None   Collection Time: 09/30/18 12:54 PM   Result Value Ref Range   WBC 4.5 4.0 - 10.5 K/uL   RBC 5.52 4.22 - 5.81 MIL/uL   Hemoglobin 15.7 13.0 - 17.0 g/dL   HCT 34.0 35.2 - 48.1 %   MCV 87.0 80.0 - 100.0 fL   MCH 28.4 26.0 - 34.0 pg   MCHC 32.7 30.0 - 36.0 g/dL   RDW 85.9 09.3 - 11.2 %   Platelets 241 150 - 400 K/uL   nRBC 0.0 0.0 - 0.2 %   Neutrophils Relative % 72 %   Neutro Abs 3.2 1.7 - 7.7 K/uL   Lymphocytes Relative 15 %   Lymphs Abs 0.7 0.7 - 4.0 K/uL   Monocytes Relative 12 %   Monocytes Absolute 0.5 0.1 - 1.0 K/uL   Eosinophils Relative 1 %   Eosinophils Absolute 0.0 0.0 - 0.5 K/uL   Basophils Relative 0 %   Basophils Absolute 0.0 0.0 - 0.1 K/uL   Immature Granulocytes 0 %   Abs Immature Granulocytes 0.02 0.00 - 0.07 K/uL    Comment: Performed at [facility], [address] [place], [place] [address]  Comprehensive metabolic panel     Status: Abnormal   Collection Time: 09/30/18 12:54 PM  Result Value Ref Range   Sodium 135 135 - 145 mmol/L   Potassium 3.6 3.5 - 5.1 mmol/L   Chloride 101 98 - 111  mmol/L   CO2 23 22 - 32 mmol/L   Glucose, Bld 82 70 - 99 mg/dL   BUN 9 6 - 20 mg/dL   Creatinine, Ser 4.090.81 0.61 - 1.24 mg/dL   Calcium 9.0 8.9 - 81.110.3 mg/dL   Total Protein 6.7 6.5 - 8.1 g/dL   Albumin 3.7 3.5 - 5.0 g/dL   AST 914291 (H) 15 - 41 U/L   ALT 479 (H) 0 - 44 U/L   Alkaline Phosphatase 101 38 - 126 U/L   Total Bilirubin 6.0 (H) 0.3 - 1.2 mg/dL   GFR calc non Af Amer >60 >60 mL/min   GFR calc Af Amer >60 >60 mL/min   Anion gap 11 5 - 15    Comment: Performed at [facility], [address] [place], [place] [address]  Lipase, blood     Status: Abnormal   Collection Time: 09/30/18 12:54 PM  Result Value Ref Range   Lipase 676 (H) 11 - 51 U/L    Comment: RESULTS CONFIRMED BY MANUAL DILUTION Performed at [facility], [address] [place], [place] [address]    [place] Abdomen Limited Ruq  Result Date: 09/30/2018 CLINICAL DATA:  Right upper abdominal pain EXAM: ULTRASOUND ABDOMEN  LIMITED RIGHT UPPER QUADRANT COMPARISON:  June 26, 2018 FINDINGS: Gallbladder: Within the gallbladder, there are multiple echogenic foci which move and shadow consistent with cholelithiasis. Largest individual gallstone measures 9 mm in length. There is no gallbladder wall thickening or pericholecystic fluid. No sonographic Murphy sign noted by sonographer. Common bile duct: Diameter: 4 mm. No intrahepatic or extrahepatic biliary duct dilatation. Liver: No focal lesion identified. Within normal limits in parenchymal echogenicity. Portal vein is patent on color Doppler imaging with normal direction of blood flow towards the liver. IMPRESSION: Cholelithiasis. No gallbladder wall thickening or pericholecystic fluid. Study otherwise unremarkable. Electronically Signed   By: Bretta BangWilliam  Woodruff III M.D.   On: 09/30/2018 13:30    Pending Labs Unresulted Labs (From admission, onward)    Start     Ordered   10/07/18 0500  Creatinine, serum  (enoxaparin (LOVENOX)    CrCl >/= 30 ml/min)  Weekly,   R    Comments:  while on enoxaparin therapy    09/30/18 1452   10/01/18 0500  Comprehensive metabolic panel  Tomorrow morning,   R     09/30/18 1452   10/01/18 0500  CBC  Tomorrow morning,   R     09/30/18 1452   10/01/18 0500  Lipase, blood  Tomorrow morning,   R     09/30/18 1452   09/30/18 1449  HIV antibody (Routine Testing)  Once,   R     09/30/18 1452   09/30/18 1449  CBC  (enoxaparin (LOVENOX)    CrCl >/= 30 ml/min)  Once,   R    Comments:  Baseline for enoxaparin therapy IF NOT ALREADY DRAWN.  Notify MD if PLT < 100 K.    09/30/18 1452   09/30/18 1449  Creatinine, serum  (enoxaparin (LOVENOX)    CrCl >/= 30 ml/min)  Once,   R    Comments:  Baseline for enoxaparin therapy IF NOT ALREADY DRAWN.    09/30/18 1452   09/30/18 1349  Urinalysis, Routine w reflex microscopic  Once,   R     09/30/18 1348          Vitals/Pain Today's Vitals   09/30/18 1500 09/30/18 1502 09/30/18 1502 09/30/18 1503   BP: 128/84  Pulse: (!) 106     Resp: 16     Temp:      TempSrc:      SpO2: 96%     Weight:    77.1 kg  Height:    5\' 9"  (1.753 m)  PainSc:  0-No pain 0-No pain     Isolation Precautions No active isolations  Medications Medications  enoxaparin (LOVENOX) injection 40 mg (has no administration in time range)  0.45 % NaCl with KCl 20 mEq / L infusion (has no administration in time range)  ketorolac (TORADOL) 30 MG/ML injection 30 mg (has no administration in time range)  acetaminophen (TYLENOL) tablet 650 mg (has no administration in time range)    Or  acetaminophen (TYLENOL) suppository 650 mg (has no administration in time range)  oxyCODONE (Oxy IR/ROXICODONE) immediate release tablet 5-10 mg (has no administration in time range)  morphine 2 MG/ML injection 1-2 mg (has no administration in time range)  diphenhydrAMINE (BENADRYL) capsule 25 mg (has no administration in time range)    Or  diphenhydrAMINE (BENADRYL) injection 25 mg (has no administration in time range)  docusate sodium (COLACE) capsule 100 mg (has no administration in time range)  ondansetron (ZOFRAN-ODT) disintegrating tablet 4 mg (has no administration in time range)    Or  ondansetron (ZOFRAN) injection 4 mg (has no administration in time range)  pantoprazole (PROTONIX) injection 40 mg (has no administration in time range)  metoprolol tartrate (LOPRESSOR) injection 5 mg (has no administration in time range)  acetaminophen (TYLENOL) tablet 1,000 mg (1,000 mg Oral Given 09/30/18 1355)  0.9 %  sodium chloride infusion (100 mL/hr Intravenous New Bag/Given 09/30/18 1412)  cefTRIAXone (ROCEPHIN) 2 g in sodium chloride 0.9 % 100 mL IVPB (0 g Intravenous Stopped 09/30/18 1501)    Mobility walks Low fall risk   Focused Assessments gastro   R Recommendations: See Admitting Provider Note  Report given to:   Additional Notes:

## 2018-09-30 NOTE — ED Provider Notes (Signed)
[facility] EMERGENCY DEPARTMENT Provider Note   CSN: [id number] Arrival date & time: 09/30/18  1226    History   Chief Complaint Chief Complaint  Patient presents with  . Abdominal Pain    HPI [name] is a 48 y.o. male.     The history is provided by the patient.  Abdominal Pain  Pain location:  RUQ and epigastric Pain quality: aching, gnawing and squeezing   Pain radiates to:  R shoulder and back Pain severity:  Moderate Onset quality:  Gradual Duration:  4 days Timing:  Intermittent Progression:  Worsening Chronicity:  Recurrent Context: diet changes   Context: not alcohol use and not previous surgeries   Relieved by:  OTC medications Worsened by:  Movement, palpation and eating Associated symptoms: nausea   Associated symptoms: no chest pain, no chills, no cough, no diarrhea, no dysuria, no fatigue, no fever, no shortness of breath and no vomiting   Risk factors: no alcohol abuse     Past Medical History:  Diagnosis Date  . H. pylori infection     There are no active problems to display for this patient.   Past Surgical History:  Procedure Laterality Date  . GASTROSTOMY W/ FEEDING TUBE    . TRACHEOSTOMY    . vocal cord repair          Home Medications    Prior to Admission medications   Medication Sig Start Date End Date Taking? Authorizing Provider  amoxicillin (AMOXIL) 500 MG tablet Take 2 tablets (1,000 mg total) by mouth 2 (two) times daily. 04/07/13   Cathlyn Parsons, NP  clarithromycin (BIAXIN) 500 MG tablet Take 1 tablet (500 mg total) by mouth 2 (two) times daily. 04/07/13   Cathlyn Parsons, NP  HYDROcodone-acetaminophen (NORCO) 5-325 MG tablet Take 1-2 tablets by mouth every 4 (four) hours as needed for moderate pain. 06/26/18   Mancel Bale, MD  omeprazole (PRILOSEC) 20 MG capsule Take 1 capsule (20 mg total) by mouth 2 (two) times daily. 04/07/13   Cathlyn Parsons, NP    Family History No family history on  file.  Social History Social History   Tobacco Use  . Smoking status: Never Smoker  Substance Use Topics  . Alcohol use: No  . Drug use: Not on file     Allergies   Patient has no known allergies.   Review of Systems Review of Systems  Constitutional: Negative for chills, fatigue and fever.  HENT: Negative for congestion and rhinorrhea.   Eyes: Negative for visual disturbance.  Respiratory: Negative for cough, shortness of breath and wheezing.   Cardiovascular: Negative for chest pain and leg swelling.  Gastrointestinal: Positive for abdominal pain and nausea. Negative for diarrhea and vomiting.  Genitourinary: Negative for dysuria and flank pain.  Musculoskeletal: Negative for neck pain and neck stiffness.  Skin: Negative for rash and wound.  Allergic/Immunologic: Negative for immunocompromised state.  Neurological: Negative for syncope, weakness and headaches.     Physical Exam Updated Vital Signs BP 134/86 (BP Location: Right Arm)   Pulse 99   Temp 99 F (37.2 C) (Oral)   Resp (!) 26   SpO2 95%   Physical Exam Vitals signs and nursing note reviewed.  Constitutional:      General: He is not in acute distress.    Appearance: He is well-developed.  HENT:     Head: Normocephalic and atraumatic.  Eyes:     Conjunctiva/sclera: Conjunctivae normal.  Neck:  Musculoskeletal: Neck supple.  Cardiovascular:     Rate and Rhythm: Normal rate and regular rhythm.     Heart sounds: Normal heart sounds. No murmur. No friction rub.  Pulmonary:     Effort: Pulmonary effort is normal. No respiratory distress.     Breath sounds: Normal breath sounds. No wheezing or rales.  Abdominal:     General: There is no distension.     Palpations: Abdomen is soft.     Tenderness: There is abdominal tenderness in the right upper quadrant. There is no guarding or rebound. Negative signs include Murphy's sign.  Skin:    General: Skin is warm.     Capillary Refill: Capillary refill  takes less than 2 seconds.  Neurological:     Mental Status: He is alert and oriented to person, place, and time.     Motor: No abnormal muscle tone.      ED Treatments / Results  Labs (all labs ordered are listed, but only abnormal results are displayed) Labs Reviewed  COMPREHENSIVE METABOLIC PANEL - Abnormal; Notable for the following components:      Result Value   AST 291 (*)    ALT 479 (*)    Total Bilirubin 6.0 (*)    All other components within normal limits  CBC WITH DIFFERENTIAL/PLATELET  LIPASE, BLOOD  URINALYSIS, ROUTINE W REFLEX MICROSCOPIC    EKG None  Radiology US Abdomen Limited Ruq  Result Date: 09/30/2018 CLINICAL DATA:  Right upper abdominal pain EXAM: ULTRASOUND ABDOMEN LIMITED RIGHT UPPER QUADRANT COMPARISON:  June 26, 2018 FINDINGS: Gallbladder: Within the gallbladder, there are multiple echogenic foci which move and shadow consistent with cholelithiasis. Largest individual gallstone measures 9 mm in length. There is no gallbladder wall thickening or pericholecystic fluid. No sonographic Murphy sign noted by sonographer. Common bile duct: Diameter: 4 mm. No intrahepatic or extrahepatic biliary duct dilatation. Liver: No focal lesion identified. Within normal limits in parenchymal echogenicity. Portal vein is patent on color Doppler imaging with normal direction of blood flow towards the liver. IMPRESSION: Cholelithiasis. No gallbladder wall thickening or pericholecystic fluid. Study otherwise unremarkable. Electronically Signed   By: Bretta Bang III M.D.   On: 09/30/2018 13:30    Procedures Procedures (including critical care time)  Medications Ordered in ED Medications  0.9 %  sodium chloride infusion (has no administration in time range)  cefTRIAXone (ROCEPHIN) 2 g in sodium chloride 0.9 % 100 mL IVPB (has no administration in time range)  acetaminophen (TYLENOL) tablet 1,000 mg (1,000 mg Oral Given 09/30/18 1355)     Initial Impression /  Assessment and Plan / ED Course  I have reviewed the triage vital signs and the nursing notes.  Pertinent labs & imaging results that were available during my care of the patient were reviewed by me and considered in my medical decision making (see chart for details).        48 yo m with h/o gallstones here w/ RUQ pain. History, exam is c/w recurrent symptomatic stones. Though pain improved now and WBC normal, LFTs, bIli significantly elevated. U/S shows no evidence of cholecystitis but is c/f cholelithiasis. Will d/w surgery. Rocephin, NPO, IVF.  Final Clinical Impressions(s) / ED Diagnoses   Final diagnoses:  RUQ pain  Calculus of gallbladder with biliary obstruction but without cholecystitis    ED Discharge Orders    None       Shaune Pollack, MD 09/30/18 1357
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
## 2018-10-01 ENCOUNTER — Inpatient Hospital Stay (HOSPITAL_COMMUNITY): Payer: Medicare Other | Admitting: Anesthesiology

## 2018-10-01 ENCOUNTER — Inpatient Hospital Stay (HOSPITAL_COMMUNITY): Payer: Medicare Other

## 2018-10-01 ENCOUNTER — Encounter (HOSPITAL_COMMUNITY): Admission: EM | Disposition: A | Discharge status: Home / Self Care | Payer: Self-pay | Source: Home / Self Care

## 2018-10-01 HISTORY — PX: CHOLECYSTECTOMY: SHX55

## 2018-10-01 LAB — HIV ANTIBODY (ROUTINE TESTING W REFLEX): HIV Screen 4th Generation wRfx: NONREACTIVE

## 2018-10-01 LAB — COMPREHENSIVE METABOLIC PANEL
ALT: 439 U/L — ABNORMAL HIGH (ref 0–44)
AST: 214 U/L — ABNORMAL HIGH (ref 15–41)
Albumin: 3.7 g/dL (ref 3.5–5.0)
Alkaline Phosphatase: 111 U/L (ref 38–126)
Anion gap: 11 (ref 5–15)
BUN: 8 mg/dL (ref 6–20)
CO2: 22 mmol/L (ref 22–32)
Calcium: 8.9 mg/dL (ref 8.9–10.3)
Chloride: 102 mmol/L (ref 98–111)
Creatinine, Ser: 0.93 mg/dL (ref 0.61–1.24)
GFR calc Af Amer: >60 mL/min (ref >60)
GFR calc non Af Amer: >60 mL/min (ref >60)
Glucose, Bld: 58 mg/dL — ABNORMAL LOW (ref 70–99)
Potassium: 3.6 mmol/L (ref 3.5–5.1)
Sodium: 135 mmol/L (ref 135–145)
Total Bilirubin: 3.4 mg/dL — ABNORMAL HIGH (ref 0.3–1.2)
Total Protein: 7 g/dL (ref 6.5–8.1)

## 2018-10-01 LAB — CBC
HCT: 47 % (ref 39.0–52.0)
Hemoglobin: 15.3 g/dL (ref 13.0–17.0)
MCH: 28.7 pg (ref 26.0–34.0)
MCHC: 32.6 g/dL (ref 30.0–36.0)
MCV: 88 fL (ref 80.0–100.0)
Platelets: 252 10*3/uL (ref 150–400)
RBC: 5.34 MIL/uL (ref 4.22–5.81)
RDW: 13.1 % (ref 11.5–15.5)
WBC: 5.8 10*3/uL (ref 4.0–10.5)
nRBC: 0 % (ref 0.0–0.2)

## 2018-10-01 LAB — LIPASE, BLOOD: Lipase: 95 U/L — ABNORMAL HIGH (ref 11–51)

## 2018-10-01 SURGERY — LAPAROSCOPIC CHOLECYSTECTOMY WITH INTRAOPERATIVE CHOLANGIOGRAM
Anesthesia: General | Site: Abdomen

## 2018-10-01 MED ORDER — MIDAZOLAM HCL 2 MG/2ML IJ SOLN
INTRAMUSCULAR | Status: AC
  Filled 2018-10-01: qty 2

## 2018-10-01 MED ORDER — PHENYLEPHRINE 40 MCG/ML (10ML) SYRINGE FOR IV PUSH (FOR BLOOD PRESSURE SUPPORT)
PREFILLED_SYRINGE | INTRAVENOUS | Status: AC
  Filled 2018-10-01: qty 10

## 2018-10-01 MED ORDER — SUCCINYLCHOLINE CHLORIDE 200 MG/10ML IV SOSY
PREFILLED_SYRINGE | INTRAVENOUS | Status: AC
  Filled 2018-10-01: qty 10

## 2018-10-01 MED ORDER — LACTATED RINGERS IV SOLN
INTRAVENOUS | Status: DC | PRN
  Administered 2018-10-01 (×2): via INTRAVENOUS

## 2018-10-01 MED ORDER — PROPOFOL 10 MG/ML IV BOLUS
INTRAVENOUS | Status: DC | PRN
  Administered 2018-10-01: 150 mg via INTRAVENOUS

## 2018-10-01 MED ORDER — ACETAMINOPHEN 500 MG PO TABS: 1000.0000 mg | ORAL_TABLET | Freq: Once | ORAL | Status: DC | PRN

## 2018-10-01 MED ORDER — GABAPENTIN 100 MG PO CAPS
200.0000 mg | ORAL_CAPSULE | Freq: Two times a day (BID) | ORAL | Status: DC
  Administered 2018-10-01 (×2): 200 mg via ORAL
  Filled 2018-10-01 (×3): qty 2

## 2018-10-01 MED ORDER — HEMOSTATIC AGENTS (NO CHARGE) OPTIME
TOPICAL | Status: DC | PRN
  Administered 2018-10-01: 1 via TOPICAL

## 2018-10-01 MED ORDER — DEXAMETHASONE SODIUM PHOSPHATE 10 MG/ML IJ SOLN
INTRAMUSCULAR | Status: AC
  Filled 2018-10-01: qty 1

## 2018-10-01 MED ORDER — ROCURONIUM BROMIDE 50 MG/5ML IV SOSY
PREFILLED_SYRINGE | INTRAVENOUS | Status: AC
  Filled 2018-10-01: qty 5

## 2018-10-01 MED ORDER — FENTANYL CITRATE (PF) 250 MCG/5ML IJ SOLN
INTRAMUSCULAR | Status: AC
  Filled 2018-10-01: qty 5

## 2018-10-01 MED ORDER — SUGAMMADEX SODIUM 200 MG/2ML IV SOLN
INTRAVENOUS | Status: DC | PRN
  Administered 2018-10-01: 160 mg via INTRAVENOUS

## 2018-10-01 MED ORDER — SODIUM CHLORIDE 0.9 % IV SOLN
INTRAVENOUS | Status: DC | PRN
  Administered 2018-10-01: 50 ug/min via INTRAVENOUS

## 2018-10-01 MED ORDER — FENTANYL CITRATE (PF) 100 MCG/2ML IJ SOLN
INTRAMUSCULAR | Status: DC | PRN
  Administered 2018-10-01: 50 ug via INTRAVENOUS
  Administered 2018-10-01: 150 ug via INTRAVENOUS

## 2018-10-01 MED ORDER — ONDANSETRON HCL 4 MG/2ML IJ SOLN
INTRAMUSCULAR | Status: DC | PRN
  Administered 2018-10-01: 4 mg via INTRAVENOUS

## 2018-10-01 MED ORDER — OXYCODONE HCL 5 MG/5ML PO SOLN: 5.0000 mg | Freq: Once | ORAL | Status: DC | PRN

## 2018-10-01 MED ORDER — LIDOCAINE 2% (20 MG/ML) 5 ML SYRINGE
INTRAMUSCULAR | Status: AC
  Filled 2018-10-01: qty 5

## 2018-10-01 MED ORDER — LIDOCAINE 2% (20 MG/ML) 5 ML SYRINGE
INTRAMUSCULAR | Status: DC | PRN
  Administered 2018-10-01: 60 mg via INTRAVENOUS

## 2018-10-01 MED ORDER — ONDANSETRON HCL 4 MG/2ML IJ SOLN
INTRAMUSCULAR | Status: AC
  Filled 2018-10-01: qty 2

## 2018-10-01 MED ORDER — CEFOTETAN DISODIUM-DEXTROSE 2-2.08 GM-%(50ML) IV SOLR
2.0000 g | INTRAVENOUS | Status: AC
  Administered 2018-10-01: 30 g via INTRAVENOUS
  Filled 2018-10-01: qty 50

## 2018-10-01 MED ORDER — ACETAMINOPHEN 10 MG/ML IV SOLN: 1000.0000 mg | Freq: Once | INTRAVENOUS | Status: DC | PRN

## 2018-10-01 MED ORDER — ACETAMINOPHEN 160 MG/5ML PO SOLN: 1000.0000 mg | Freq: Once | ORAL | Status: DC | PRN

## 2018-10-01 MED ORDER — SUCCINYLCHOLINE CHLORIDE 200 MG/10ML IV SOSY
PREFILLED_SYRINGE | INTRAVENOUS | Status: DC | PRN
  Administered 2018-10-01: 80 mg via INTRAVENOUS

## 2018-10-01 MED ORDER — 0.9 % SODIUM CHLORIDE (POUR BTL) OPTIME
TOPICAL | Status: DC | PRN
  Administered 2018-10-01: 1000 mL

## 2018-10-01 MED ORDER — ACETAMINOPHEN 500 MG PO TABS
1000.0000 mg | ORAL_TABLET | Freq: Once | ORAL | Status: AC
  Administered 2018-10-01: 1000 mg via ORAL
  Filled 2018-10-01: qty 2

## 2018-10-01 MED ORDER — SODIUM CHLORIDE 0.9 % IR SOLN
Status: DC | PRN
  Administered 2018-10-01: 1

## 2018-10-01 MED ORDER — GABAPENTIN 300 MG PO CAPS
300.0000 mg | ORAL_CAPSULE | Freq: Once | ORAL | Status: AC
  Administered 2018-10-01: 300 mg via ORAL
  Filled 2018-10-01: qty 1

## 2018-10-01 MED ORDER — BUPIVACAINE-EPINEPHRINE 0.25% -1:200000 IJ SOLN
INTRAMUSCULAR | Status: DC | PRN
  Administered 2018-10-01: 22 mL

## 2018-10-01 MED ORDER — SODIUM CHLORIDE 0.9 % IV SOLN
INTRAVENOUS | Status: DC | PRN
  Administered 2018-10-01: 11:00:00 8 mL

## 2018-10-01 MED ORDER — DEXAMETHASONE SODIUM PHOSPHATE 10 MG/ML IJ SOLN
INTRAMUSCULAR | Status: DC | PRN
  Administered 2018-10-01: 10 mg via INTRAVENOUS

## 2018-10-01 MED ORDER — FENTANYL CITRATE (PF) 100 MCG/2ML IJ SOLN: 25.0000 ug | INTRAMUSCULAR | Status: DC | PRN

## 2018-10-01 MED ORDER — LACTATED RINGERS IV SOLN
INTRAVENOUS | Status: DC
  Administered 2018-10-01: 08:00:00 via INTRAVENOUS

## 2018-10-01 MED ORDER — PHENYLEPHRINE HCL 10 MG/ML IJ SOLN
INTRAMUSCULAR | Status: DC | PRN
  Administered 2018-10-01: 80 ug via INTRAVENOUS
  Administered 2018-10-01: 120 ug via INTRAVENOUS
  Administered 2018-10-01: 80 ug via INTRAVENOUS
  Administered 2018-10-01: 120 ug via INTRAVENOUS

## 2018-10-01 MED ORDER — ROCURONIUM BROMIDE 50 MG/5ML IV SOSY
PREFILLED_SYRINGE | INTRAVENOUS | Status: DC | PRN
  Administered 2018-10-01: 10 mg via INTRAVENOUS
  Administered 2018-10-01: 40 mg via INTRAVENOUS

## 2018-10-01 MED ORDER — PROPOFOL 10 MG/ML IV BOLUS
INTRAVENOUS | Status: AC
  Filled 2018-10-01: qty 20

## 2018-10-01 MED ORDER — OXYCODONE HCL 5 MG PO TABS: 5.0000 mg | ORAL_TABLET | Freq: Once | ORAL | Status: DC | PRN

## 2018-10-01 MED ORDER — MIDAZOLAM HCL 5 MG/5ML IJ SOLN
INTRAMUSCULAR | Status: DC | PRN
  Administered 2018-10-01: 2 mg via INTRAVENOUS

## 2018-10-01 MED ORDER — POTASSIUM CHLORIDE IN NACL 20-0.45 MEQ/L-% IV SOLN
INTRAVENOUS | Status: DC
  Filled 2018-10-01: qty 1000

## 2018-10-01 MED ORDER — SUCCINYLCHOLINE CHLORIDE 20 MG/ML IJ SOLN
INTRAMUSCULAR | Status: DC | PRN
  Administered 2018-10-01: 80 mg via INTRAVENOUS

## 2018-10-01 SURGICAL SUPPLY — 50 items
APL SKNCLS STERI-STRIP NONHPOA (GAUZE/BANDAGES/DRESSINGS) ×1
APPLIER CLIP 5 13 M/L LIGAMAX5 (MISCELLANEOUS) ×2
APR CLP MED LRG 5 ANG JAW (MISCELLANEOUS) ×1
BAG SPEC RTRVL 10 TROC 200 (ENDOMECHANICALS) ×1
BANDAGE ADH SHEER 1  50/CT (GAUZE/BANDAGES/DRESSINGS) ×6 IMPLANT
BENZOIN TINCTURE PRP APPL 2/3 (GAUZE/BANDAGES/DRESSINGS) ×2 IMPLANT
BLADE CLIPPER SURG (BLADE) IMPLANT
CANISTER SUCT 3000ML PPV (MISCELLANEOUS) ×2 IMPLANT
CHLORAPREP W/TINT 26ML (MISCELLANEOUS) ×2 IMPLANT
CLIP APPLIE 5 13 M/L LIGAMAX5 (MISCELLANEOUS) ×1 IMPLANT
COVER MAYO STAND STRL (DRAPES) ×2 IMPLANT
COVER SURGICAL LIGHT HANDLE (MISCELLANEOUS) ×2 IMPLANT
COVER WAND RF STERILE (DRAPES) ×1 IMPLANT
DRAPE C-ARM 42X72 X-RAY (DRAPES) ×2 IMPLANT
DRSG TEGADERM 4X4.75 (GAUZE/BANDAGES/DRESSINGS) ×2 IMPLANT
ELECT REM PT RETURN 9FT ADLT (ELECTROSURGICAL) ×2
ELECTRODE REM PT RTRN 9FT ADLT (ELECTROSURGICAL) ×1 IMPLANT
GAUZE SPONGE 2X2 8PLY STRL LF (GAUZE/BANDAGES/DRESSINGS) ×1 IMPLANT
GLOVE BIOGEL M STRL SZ7.5 (GLOVE) ×2 IMPLANT
GLOVE BIOGEL PI IND STRL 8 (GLOVE) ×2 IMPLANT
GLOVE BIOGEL PI INDICATOR 8 (GLOVE) ×1
GOWN STRL REUS W/ TWL LRG LVL3 (GOWN DISPOSABLE) ×3 IMPLANT
GOWN STRL REUS W/TWL 2XL LVL3 (GOWN DISPOSABLE) ×2 IMPLANT
GOWN STRL REUS W/TWL LRG LVL3 (GOWN DISPOSABLE) ×6
GRASPER SUT TROCAR 14GX15 (MISCELLANEOUS) IMPLANT
HEMOSTAT SNOW SURGICEL 2X4 (HEMOSTASIS) ×1 IMPLANT
KIT BASIN OR (CUSTOM PROCEDURE TRAY) ×2 IMPLANT
KIT TURNOVER KIT B (KITS) ×2 IMPLANT
NS IRRIG 1000ML POUR BTL (IV SOLUTION) ×2 IMPLANT
PAD ARMBOARD 7.5X6 YLW CONV (MISCELLANEOUS) ×2 IMPLANT
POUCH RETRIEVAL ECOSAC 10 (ENDOMECHANICALS) ×1 IMPLANT
POUCH RETRIEVAL ECOSAC 10MM (ENDOMECHANICALS) ×1
SCISSORS LAP 5X35 DISP (ENDOMECHANICALS) ×2 IMPLANT
SET CHOLANGIOGRAPH 5 50 .035 (SET/KITS/TRAYS/PACK) ×2 IMPLANT
SET IRRIG TUBING LAPAROSCOPIC (IRRIGATION / IRRIGATOR) ×2 IMPLANT
SET TUBE SMOKE EVAC HIGH FLOW (TUBING) ×2 IMPLANT
SLEEVE ENDOPATH XCEL 5M (ENDOMECHANICALS) ×4 IMPLANT
SPECIMEN JAR SMALL (MISCELLANEOUS) ×2 IMPLANT
SPONGE GAUZE 2X2 STER 10/PKG (GAUZE/BANDAGES/DRESSINGS) ×1
STRIP CLOSURE SKIN 1/2X4 (GAUZE/BANDAGES/DRESSINGS) ×2 IMPLANT
SUT MNCRL AB 4-0 PS2 18 (SUTURE) ×2 IMPLANT
SUT VIC AB 0 UR5 27 (SUTURE) IMPLANT
SUT VICRYL 0 UR6 27IN ABS (SUTURE) IMPLANT
TOWEL OR 17X24 6PK STRL BLUE (TOWEL DISPOSABLE) ×2 IMPLANT
TOWEL OR 17X26 10 PK STRL BLUE (TOWEL DISPOSABLE) ×2 IMPLANT
TRAY LAPAROSCOPIC MC (CUSTOM PROCEDURE TRAY) ×2 IMPLANT
TROCAR BLADELESS 12MM (ENDOMECHANICALS) ×1 IMPLANT
TROCAR XCEL BLUNT TIP 100MML (ENDOMECHANICALS) ×2 IMPLANT
TROCAR XCEL NON-BLD 5MMX100MML (ENDOMECHANICALS) ×2 IMPLANT
WATER STERILE IRR 1000ML POUR (IV SOLUTION) ×2 IMPLANT

## 2018-10-01 NOTE — Discharge Instructions (Signed)

## 2018-10-01 NOTE — Anesthesia Procedure Notes (Signed)
Procedure Name: Intubation Date/Time: 10/01/2018 8:43 AM Performed by: Carmela Rima, CRNA Pre-anesthesia Checklist: Timeout performed, Patient being monitored, Suction available, Emergency Drugs available and Patient identified Patient Re-evaluated:Patient Re-evaluated prior to induction Oxygen Delivery Method: Circle system utilized Preoxygenation: Pre-oxygenation with 100% oxygen Induction Type: IV induction and Rapid sequence Laryngoscope Size: Glidescope and 3 Grade View: Grade I Tube type: Oral Tube size: 7.0 mm Number of attempts: 1 Placement Confirmation: breath sounds checked- equal and bilateral,  ETT inserted through vocal cords under direct vision and positive ETCO2 Secured at: 23 cm Tube secured with: Tape Dental Injury: Teeth and Oropharynx as per pre-operative assessment

## 2018-10-01 NOTE — Transfer of Care (Signed)
Immediate Anesthesia Transfer of Care Note  Patient: [name]  Procedure(s) Performed: LAPAROSCOPIC CHOLECYSTECTOMY WITH INTRAOPERATIVE CHOLANGIOGRAM (N/A Abdomen)  Patient Location: PACU  Anesthesia Type:General  Level of Consciousness: awake, alert  and oriented  Airway & Oxygen Therapy: Patient Spontanous Breathing and Patient connected to nasal cannula oxygen  Post-op Assessment: Report given to RN, Post -op Vital signs reviewed and stable and Patient moving all extremities X 4  Post vital signs: Reviewed and stable  Last Vitals:  Vitals Value Taken Time  BP 140/86 10/01/2018 11:16 AM  Temp    Pulse 99 10/01/2018 11:18 AM  Resp 19 10/01/2018 11:18 AM  SpO2 100 % 10/01/2018 11:18 AM  Vitals shown include unvalidated device data.  Last Pain:  Vitals:   10/01/18 0720  TempSrc:   PainSc: 0-No pain         Complications: No apparent anesthesia complications

## 2018-10-01 NOTE — Plan of Care (Signed)
  Problem: Pain Managment: Goal: General experience of comfort will improve Outcome: Progressing   

## 2018-10-01 NOTE — Op Note (Signed)
[name] [id number] 11/26/1970 10/01/2018  Laparoscopic Cholecystectomy with IOC Procedure Note  Indications: This patient presents with symptomatic gallbladder disease/gallstone panreatitis and will undergo laparoscopic cholecystectomy.  Pre-operative Diagnosis: gallstone pancreatitis  Post-operative Diagnosis: Same, acute on chronic calculous choelcystitis  Surgeon: Gaynelle AduEric Seerat Peaden MD FACS  Assistants: Wells GuilesKelly Rayburn PA-C  Anesthesia: General endotracheal anesthesia  Procedure Details  The patient was seen again in the Holding Room. The risks, benefits, complications, treatment options, and expected outcomes were discussed with the patient. The possibilities of reaction to medication, pulmonary aspiration, perforation of viscus, bleeding, recurrent infection, finding a normal gallbladder, the need for additional procedures, failure to diagnose a condition, the possible need to convert to an open procedure, and creating a complication requiring transfusion or operation were discussed with the patient. The likelihood of improving the patient's symptoms with return to their baseline status is good.  The patient and/or family concurred with the proposed plan, giving informed consent. The site of surgery properly noted. The patient was taken to Operating Room, identified as [name] and the procedure verified as Laparoscopic Cholecystectomy with Intraoperative Cholangiogram. A Time Out was held and the above information confirmed. Antibiotic prophylaxis was administered.   Prior to the induction of general anesthesia, antibiotic prophylaxis was administered. General endotracheal anesthesia was then administered and tolerated well. After the induction, the abdomen was prepped with Chloraprep and draped in the sterile fashion. The patient was positioned in the supine position.  Local anesthetic agent was injected into the skin near the umbilicus and an incision made. We dissected down to the  abdominal fascia with blunt dissection.  The fascia was incised vertically and we entered the peritoneal cavity bluntly.  A pursestring suture of 0-Vicryl was placed around the fascial opening.  The Hasson cannula was inserted and secured with the stay suture.  Pneumoperitoneum was then created with CO2 and tolerated well without any adverse changes in the patient's vital signs. An 5-mm port was placed in the subxiphoid position.  Two 5-mm ports were placed in the right upper quadrant. All skin incisions were infiltrated with a local anesthetic agent before making the incision and placing the trocars.   We positioned the patient in reverse Trendelenburg, tilted slightly to the patient's left.  The gallbladder was identified.  It was edematous and thick-walled.  I had to aspirate the gallbladder in order to facilitate retraction. the fundus grasped and retracted cephalad. Adhesions were lysed bluntly and with the electrocautery where indicated, taking care not to injure any adjacent organs or viscus. The infundibulum was grasped and retracted laterally, exposing the peritoneum overlying the triangle of Calot. This was then divided and exposed in a blunt fashion.  It took about 25 to 30 minutes to expose this area.  The tissue was densely inflamed and friable.  A critical view of the cystic duct and cystic artery was obtained.  The cystic duct was clearly identified and bluntly dissected circumferentially. The cystic duct was ligated with a clip distally.   An incision was made in the cystic duct and the [facility] introduced. The catheter was secured using a clip. A cholangiogram was then obtained which showed good visualization of the distal and proximal biliary tree with no sign of filling defects or obstruction.  Contrast flowed easily into the duodenum. The catheter was then removed.   The cystic duct was then ligated with clips and divided. The cystic artery which had been identified &  dissected free was ligated with clips and divided as  well.  There was a little bit of oozing just below the cystic duct stump.  I placed a piece of surgical snow on this area.  The gallbladder was dissected from the liver bed in retrograde fashion with the electrocautery.  The posterior wall of the gallbladder was densely fused to the liver capsule.  The gallbladder was entered and there was some spillage of gallstones into the abdominal cavity.  I changed the subxiphoid trocar to a 12 mm trocar.  The Ecco sac was placed in the abdominal cavity.  The gallbladder was placed in the sac and then I manually placed the spilled gallstones into the sac as well.  I irrigated the right upper quadrant and continue to extract and placed spilled gallstones into the sac.  After several rounds there did not appear to be any more spilled gallstones.  The gallbladder/stones within Ecco sac.  I then inspected the gallbladder fossa.  Irrigated with saline.  Hemostasis was achieved.  The gallbladder and Ecco sac were then removed through the umbilical port site after desufflated in the abdomen through the machine.  I had to enlarge the umbilical fascial defect with a pair of scissors in order to extract the specimen due to the size of the gallbladder and the stones.      Pneumoperitoneum was then reestablished.  I removed the snow from the base of the cystic duct stump.  There is no more evidence of ongoing bleeding. We again inspected the right upper quadrant for hemostasis.  I did place an new piece of snow in the gallbladder fossa and around the base of the cystic duct stump.  The 12 mm subxiphoid trocar was removed and that fascial defect was closed with interrupted 0 Vicryl using a PMI suture passer with laparoscopic assistance.  The [facility] trocar was removed and the previously placed pursestring suture was tied down.  There was still a fascial defect.  2 additional interrupted 0 Vicryl sutures were placed at the umbilical  fascia with the PMI suture passer.  There was no defect.  There is no air leak.  The umbilical closure was inspected and there was no air leak and nothing trapped within the closure. Pneumoperitoneum was released as we removed the trocars.  4-0 Monocryl was used to close the skin.   Benzoin, steri-strips, and clean dressings were applied. The patient was then extubated and brought to the recovery room in stable condition. Instrument, sponge, and needle counts were correct at closure and at the conclusion of the case.   Findings: Acute on chronic Cholecystitis with Cholelithiasis +critical view nml IOC  Estimated Blood Loss: <44ml         Drains: none         Specimens: Gallbladder           Complications: None; patient tolerated the procedure well.         Disposition: PACU - hemodynamically stable.         Condition: stable  Mary Sella. Andrey Campanile, MD, FACS General, Bariatric, & Minimally Invasive Surgery [facility], [place]

## 2018-10-01 NOTE — Interval H&P Note (Signed)
History and Physical Interval Note:  10/01/2018 7:49 AM  [name]  has presented today for surgery, with the diagnosis of Gallstone Pancreatitis.  The various methods of treatment have been discussed with the patient and family. After consideration of risks, benefits and other options for treatment, the patient has consented to  Procedure(s): LAPAROSCOPIC CHOLECYSTECTOMY WITH INTRAOPERATIVE CHOLANGIOGRAM (N/A) as a surgical intervention.  The patient's history has been reviewed, patient examined, no change in status, stable for surgery.  I have reviewed the patient's chart and labs.  Questions were answered to the patient's satisfaction.    Feels better.  No n/v Less abd pain Nontoxic, alert,  Reg symm chest rise Old neck scar Soft, min TTP, old g tube scar, ND, no guarding/rebound  I believe the patient's symptoms are consistent with gallbladder disease/ GS pancreatitis  We discussed gallbladder disease.   I discussed laparoscopic cholecystectomy with IOC in detail.  The patient was shown diagrams detailing the procedure.  We discussed the risks and benefits of a laparoscopic cholecystectomy including, but not limited to bleeding, infection, injury to surrounding structures such as the intestine or liver, bile leak, retained gallstones, need to convert to an open procedure, prolonged diarrhea, blood clots such as  DVT, common bile duct injury, anesthesia risks, and possible need for additional procedures.  We discussed the typical post-operative recovery course. I explained that the likelihood of improvement of their symptoms is good.  Arne Cleveland. Andrey Campanile, MD, FACS General, Bariatric, & Minimally Invasive Surgery [facility] Surgery, [place]
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
## 2018-10-01 NOTE — TOC Progression Note (Signed)
Transition of Care [facility]) - Progression Note    Patient Details  Name: [name] MRN: [id number] Date of Birth: [birth year]  Transition of Care [facility]) CM/SW Contact  Nadene Rubins Adria Devon, RN Phone Number: 10/01/2018, 1:01 PM  Clinical Narrative:     Patient from home with spouse.  4/7 lap chole  Expected Discharge Plan: Home/Self Care Barriers to Discharge: Continued Medical Work up  Expected Discharge Plan and Services Expected Discharge Plan: Home/Self Care       Living arrangements for the past 2 months: Single Family Home                           Social Determinants of Health (SDOH) Interventions    Readmission Risk Interventions No flowsheet data found.

## 2018-10-02 ENCOUNTER — Encounter (HOSPITAL_COMMUNITY): Payer: Self-pay | Admitting: General Surgery

## 2018-10-02 LAB — COMPREHENSIVE METABOLIC PANEL
ALT: 297 U/L — ABNORMAL HIGH (ref 0–44)
AST: 135 U/L — ABNORMAL HIGH (ref 15–41)
Albumin: 3.2 g/dL — ABNORMAL LOW (ref 3.5–5.0)
Alkaline Phosphatase: 91 U/L (ref 38–126)
Anion gap: 9 (ref 5–15)
BUN: 6 mg/dL (ref 6–20)
CO2: 22 mmol/L (ref 22–32)
Calcium: 8.7 mg/dL — ABNORMAL LOW (ref 8.9–10.3)
Chloride: 103 mmol/L (ref 98–111)
Creatinine, Ser: 0.75 mg/dL (ref 0.61–1.24)
GFR calc Af Amer: >60 mL/min (ref >60)
GFR calc non Af Amer: >60 mL/min (ref >60)
Glucose, Bld: 121 mg/dL — ABNORMAL HIGH (ref 70–99)
Potassium: 4.5 mmol/L (ref 3.5–5.1)
Sodium: 134 mmol/L — ABNORMAL LOW (ref 135–145)
Total Bilirubin: 2.4 mg/dL — ABNORMAL HIGH (ref 0.3–1.2)
Total Protein: 6.3 g/dL — ABNORMAL LOW (ref 6.5–8.1)

## 2018-10-02 LAB — CBC
HCT: 45.7 % (ref 39.0–52.0)
Hemoglobin: 14.8 g/dL (ref 13.0–17.0)
MCH: 28.4 pg (ref 26.0–34.0)
MCHC: 32.4 g/dL (ref 30.0–36.0)
MCV: 87.7 fL (ref 80.0–100.0)
Platelets: 223 10*3/uL (ref 150–400)
RBC: 5.21 MIL/uL (ref 4.22–5.81)
RDW: 13.1 % (ref 11.5–15.5)
WBC: 8.1 10*3/uL (ref 4.0–10.5)
nRBC: 0 % (ref 0.0–0.2)

## 2018-10-02 MED ORDER — DOCUSATE SODIUM 100 MG PO CAPS: 100.0000 mg | ORAL_CAPSULE | Freq: Two times a day (BID) | ORAL | 0 refills | Status: AC

## 2018-10-02 MED ORDER — POLYETHYLENE GLYCOL 3350 17 G PO PACK: 17.0000 g | PACK | Freq: Every day | ORAL | 0 refills | Status: AC

## 2018-10-02 MED ORDER — OXYCODONE HCL 5 MG PO TABS: 5.0000 mg | ORAL_TABLET | Freq: Four times a day (QID) | ORAL | 0 refills | Status: AC | PRN

## 2018-10-02 MED ORDER — POLYETHYLENE GLYCOL 3350 17 G PO PACK
17.0000 g | PACK | Freq: Every day | ORAL | Status: DC
  Administered 2018-10-02: 17 g via ORAL
  Filled 2018-10-02: qty 1

## 2018-10-02 NOTE — Discharge Summary (Signed)
[facility] Discharge Summary   Patient ID: [name] MRN: [id number] DOB/AGE: 50/06/1970 50 y.o.  Admit date: 09/30/2018 Discharge date: 10/02/2018  Admitting Diagnosis: Gallstone pancreatitis   Discharge Diagnosis Patient Active Problem List   Diagnosis Date Noted  . Gallstone pancreatitis 09/30/2018    Consultants none  Imaging: Dg Cholangiogram Operative  Result Date: 10/01/2018 CLINICAL DATA:  48 year old male with a history of cholelithiasis EXAM: INTRAOPERATIVE CHOLANGIOGRAM TECHNIQUE: Cholangiographic images from the C-arm fluoroscopic device were submitted for interpretation post-operatively. Please see the procedural report for the amount of contrast and the fluoroscopy time utilized. COMPARISON:  None. FINDINGS: Surgical instruments project over the upper abdomen. There is cannulation of the cystic duct/gallbladder neck, with antegrade infusion of contrast. Caliber of the extrahepatic ductal system within normal limits. Lucency or decreased filling of the ductal system at the point of overlap of the cystic duct and the common hepatic duct of uncertain significance. Free flow of contrast across the ampulla. IMPRESSION: Intraoperative cholangiogram demonstrates extrahepatic biliary ducts of unremarkable caliber, with vague lucency at the point of overlap of the cystic duct and the common hepatic duct of uncertain significance. This may represent a artifact of the acquisition, air, or retained stones/debris. Please refer to the dictated operative report for full details of intraoperative findings and procedure Electronically Signed   By: Gilmer MorJaime  Wagner D.O.   On: 10/01/2018 10:27   [place] Abdomen Limited Ruq  Result Date: 09/30/2018 CLINICAL DATA:  Right upper abdominal pain EXAM: ULTRASOUND ABDOMEN LIMITED RIGHT UPPER QUADRANT COMPARISON:  June 26, 2018 FINDINGS: Gallbladder: Within the gallbladder, there are multiple echogenic foci which move and shadow  consistent with cholelithiasis. Largest individual gallstone measures 9 mm in length. There is no gallbladder wall thickening or pericholecystic fluid. No sonographic Murphy sign noted by sonographer. Common bile duct: Diameter: 4 mm. No intrahepatic or extrahepatic biliary duct dilatation. Liver: No focal lesion identified. Within normal limits in parenchymal echogenicity. Portal vein is patent on color Doppler imaging with normal direction of blood flow towards the liver. IMPRESSION: Cholelithiasis. No gallbladder wall thickening or pericholecystic fluid. Study otherwise unremarkable. Electronically Signed   By: Bretta BangWilliam  Woodruff III M.D.   On: 09/30/2018 13:30    Procedures Dr. Andrey CampanileWilson (10/01/18) - Laparoscopic Cholecystectomy with IOC  HPI: [name] is a 48yo male who [facility] complaining of 4 days of abdominal pain. States that the pain started last Thursday. It is intermittent, worse with movement and PO intake. Denies nausea, vomiting, fever, chills, dysuria. Took some hydrocodone which helped and he actually has very little pain now. Describes the pain as burning. States that he had a similar episode in 06/2018 where he was seen in the ED; LFTs and lipase WNL at that time. He saw Dr. Sheliah HatchKinsinger in the office afterwards and decided to manage his symptoms of chronic cholecystitis with diet. Doing well until 4 days ago. ED workup included u/s which shows cholelithiasis with no gallbladder wall thickening or pericholecystic fluid. WBC 4.5, AST 291, ALT 479, AP 101, Tbili 6.0, lipase 676. General surgery asked to see.  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  Patient was admitted and underwent procedure listed above.  Tolerated procedure well and was transferred to the floor.  Diet was advanced as  tolerated.  On POD#1, the patient was voiding well, tolerating diet, ambulating well, pain well controlled, vital signs  stable, incisions c/d/i and felt stable for discharge home.  Patient will follow up as outlined below and knows to call with questions or concerns.     Patient was discharged in good condition.  The [place] Substance controlled database was reviewed prior to prescribing narcotic pain medication to this patient.  Physical Exam: General:  Alert, NAD, pleasant, cooperative Cardio: RRR, S1 & S2 normal, no murmur, rubs, gallops Resp: Effort normal, lungs CTA bilaterally, no wheezes, rales, rhonchi Abd:  Soft, ND, normal bowel sounds, mild tenderness, no peritonitis, incisions C/D/I  Skin: warm and dry, no rashes noted  Allergies as of 10/02/2018   No Known Allergies     Medication List    STOP taking these medications   amoxicillin 500 MG tablet Commonly known as:  AMOXIL   clarithromycin 500 MG tablet Commonly known as:  BIAXIN   HYDROcodone-acetaminophen 5-325 MG tablet Commonly known as:  Norco   omeprazole 20 MG capsule Commonly known as:  PRILOSEC     TAKE these medications   docusate sodium 100 MG capsule Commonly known as:  COLACE Take 1 capsule (100 mg total) by mouth 2 (two) times daily.   oxyCODONE 5 MG immediate release tablet Commonly known as:  Oxy IR/ROXICODONE Take 1 tablet (5 mg total) by mouth every 6 (six) hours as needed for moderate pain.   polyethylene glycol packet Commonly known as:  MIRALAX / GLYCOLAX Take 17 g by mouth daily.        Follow-up Information    [facility] Call on 10/17/2018.   Specialty:  General Surgery Why:  Appointment scheduled for 1:45 PM. A provider will call you during scheduled appointment time. Please send a photo of your incisions with your name and DOB to [email or web address] the day prior to appointment.  Contact information: [address] [place]  [address] [phone]        [facility]. Go on 10/08/2018.   Why:  for lab work, no appointment needed. Please call [facility] if you have any questions about this lab work.  [phone] Contact information: [address]           Signed: Kathyrn Drown [facility] 10/02/2018, 8:31 AM Pager: [phone] Consults: [phone] Mon-Fri 7:00 am-4:30 pm Sat-Sun 7:00 am-11:30 am

## 2018-10-02 NOTE — Progress Notes (Signed)
1600 Pt was ambulating to the hall with steady gait. Tolerated regular diet today. Abd is soft, 4 lap sites with steri strips dry and intact. Pt is ambulating to the hall. Discharge instructions given to pt. Verbalized understanding. Discharged to home picked up by wife.

## 2018-10-04 NOTE — Anesthesia Postprocedure Evaluation (Signed)
Anesthesia Post Note  Patient: [name]  Procedure(s) Performed: LAPAROSCOPIC CHOLECYSTECTOMY WITH INTRAOPERATIVE CHOLANGIOGRAM (N/A Abdomen)     Patient location during evaluation: PACU Anesthesia Type: General Level of consciousness: awake and alert Pain management: pain level controlled Vital Signs Assessment: post-procedure vital signs reviewed and stable Respiratory status: spontaneous breathing, nonlabored ventilation, respiratory function stable and patient connected to nasal cannula oxygen Cardiovascular status: blood pressure returned to baseline and stable Postop Assessment: no apparent nausea or vomiting Anesthetic complications: no    Last Vitals:  Vitals:   10/02/18 0406 10/02/18 1035  BP: 126/81 122/83  Pulse: 96 92  Resp: 16 16  Temp: 36.9 C 36.9 C  SpO2: 99% 99%    Last Pain:  Vitals:   10/02/18 1035  TempSrc: Oral  PainSc:                  Cindie Rajagopalan

## 2020-12-01 ENCOUNTER — Emergency Department (HOSPITAL_COMMUNITY)
Admission: EM | Admit: 2020-12-01 | Discharge: 2020-12-02 | Disposition: A | Discharge status: Home / Self Care | Payer: Self-pay | Attending: Emergency Medicine | Admitting: Emergency Medicine

## 2020-12-01 ENCOUNTER — Encounter (HOSPITAL_COMMUNITY): Payer: Self-pay

## 2020-12-01 ENCOUNTER — Other Ambulatory Visit: Payer: Self-pay

## 2020-12-01 DIAGNOSIS — X58XXXA Exposure to other specified factors, initial encounter: Secondary | ICD-10-CM | POA: Insufficient documentation

## 2020-12-01 DIAGNOSIS — S161XXA Strain of muscle, fascia and tendon at neck level, initial encounter: Secondary | ICD-10-CM | POA: Insufficient documentation

## 2020-12-01 LAB — BASIC METABOLIC PANEL
Anion gap: 7 (ref 5–15)
BUN: 15 mg/dL (ref 6–20)
CO2: 26 mmol/L (ref 22–32)
Calcium: 8.9 mg/dL (ref 8.9–10.3)
Chloride: 106 mmol/L (ref 98–111)
Creatinine, Ser: 0.91 mg/dL (ref 0.61–1.24)
GFR, Estimated: >60 mL/min (ref >60)
Glucose, Bld: 77 mg/dL (ref 70–99)
Potassium: 3.7 mmol/L (ref 3.5–5.1)
Sodium: 139 mmol/L (ref 135–145)

## 2020-12-01 LAB — CBC WITH DIFFERENTIAL/PLATELET
Abs Immature Granulocytes: 0.02 10*3/uL (ref 0.00–0.07)
Basophils Absolute: 0.1 10*3/uL (ref 0.0–0.1)
Basophils Relative: 1 %
Eosinophils Absolute: 0.2 10*3/uL (ref 0.0–0.5)
Eosinophils Relative: 3 %
HCT: 47.1 % (ref 39.0–52.0)
Hemoglobin: 15.7 g/dL (ref 13.0–17.0)
Immature Granulocytes: 0 %
Lymphocytes Relative: 39 %
Lymphs Abs: 1.8 10*3/uL (ref 0.7–4.0)
MCH: 29.5 pg (ref 26.0–34.0)
MCHC: 33.3 g/dL (ref 30.0–36.0)
MCV: 88.5 fL (ref 80.0–100.0)
Monocytes Absolute: 0.5 10*3/uL (ref 0.1–1.0)
Monocytes Relative: 11 %
Neutro Abs: 2.1 10*3/uL (ref 1.7–7.7)
Neutrophils Relative %: 46 %
Platelets: 235 10*3/uL (ref 150–400)
RBC: 5.32 MIL/uL (ref 4.22–5.81)
RDW: 12.7 % (ref 11.5–15.5)
WBC: 4.7 10*3/uL (ref 4.0–10.5)
nRBC: 0 % (ref 0.0–0.2)

## 2020-12-01 NOTE — ED Triage Notes (Signed)
Pt states that he has been having neck pain for the past week on and off. Reports it sometimes causes dizziness

## 2020-12-01 NOTE — ED Provider Notes (Signed)
Emergency Medicine Provider Triage Evaluation Note  [name] , a 50 y.o. male  was evaluated in triage.  Pt complains of intermittent neck pain. He further reports swelling to the left side of the neck.  Review of Systems  Positive: Neck swelling/pain Negative: fever  Physical Exam  BP (!) 141/108   Pulse 75   Temp 98.5 F (36.9 C) (Oral)   Resp 18   SpO2 99%  Gen:   Awake, no distress   Resp:  Normal effort  MSK:   Moves extremities without difficulty  Other:  Swelling noted to left side of neck, area is nontender  Medical Decision Making  Medically screening exam initiated at 9:50 PM.  Appropriate orders placed.  Jasmin Trumbull was informed that the remainder of the evaluation will be completed by another provider, this initial triage assessment does not replace that evaluation, and the importance of remaining in the ED until their evaluation is complete.  Swelling to left side of neck, possible painless lymphadenopathy. Will order labs and ct soft tissue neck for further eval.    Karrie Meres, PA-C 12/01/20 2153    Pricilla Loveless, MD 12/01/20 [phone]

## 2020-12-02 ENCOUNTER — Emergency Department (HOSPITAL_COMMUNITY): Payer: Self-pay

## 2020-12-02 MED ORDER — ACETAMINOPHEN 500 MG PO TABS
1000.0000 mg | ORAL_TABLET | Freq: Once | ORAL | Status: AC
  Administered 2020-12-02: 1000 mg via ORAL
  Filled 2020-12-02: qty 2

## 2020-12-02 MED ORDER — METHOCARBAMOL 500 MG PO TABS
500.0000 mg | ORAL_TABLET | Freq: Once | ORAL | Status: AC
  Administered 2020-12-02: 500 mg via ORAL
  Filled 2020-12-02: qty 1

## 2020-12-02 MED ORDER — IOHEXOL 300 MG/ML  SOLN
75.0000 mL | Freq: Once | INTRAMUSCULAR | Status: AC | PRN
  Administered 2020-12-02: 75 mL via INTRAVENOUS

## 2020-12-02 MED ORDER — METHOCARBAMOL 500 MG PO TABS: 500.0000 mg | ORAL_TABLET | Freq: Two times a day (BID) | ORAL | 0 refills | Status: AC

## 2020-12-02 NOTE — ED Provider Notes (Signed)
[facility] EMERGENCY DEPARTMENT Provider Note  CSN: [id number] Arrival date & time: 12/01/20 2138  Chief Complaint(s) Torticollis  HPI [name] is a 50 y.o. male here with 1 week of constant neck discomfort. Aching down to right shoulder. Waxing and waning. Worse with ROM. Left side of neck feels "tight." No fall or trauma. No fever or recent infection. No sore throat or SOB.   The history is provided by the patient.   Past Medical History Past Medical History:  Diagnosis Date  . H. pylori infection    Patient Active Problem List   Diagnosis Date Noted  . Gallstone pancreatitis 09/30/2018   Home Medication(s) Prior to Admission medications   Medication Sig Start Date End Date Taking? Authorizing Provider  methocarbamol (ROBAXIN) 500 MG tablet Take 1 tablet (500 mg total) by mouth 2 (two) times daily. 12/02/20  Yes Besnik Febus, Amadeo Garnet, MD  docusate sodium (COLACE) 100 MG capsule Take 1 capsule (100 mg total) by mouth 2 (two) times daily. 10/02/18   Focht, Joyce Copa, PA  oxyCODONE (OXY IR/ROXICODONE) 5 MG immediate release tablet Take 1 tablet (5 mg total) by mouth every 6 (six) hours as needed for moderate pain. 10/02/18   Focht, Joyce Copa, PA  polyethylene glycol (MIRALAX / GLYCOLAX) packet Take 17 g by mouth daily. 10/02/18   Jerre Simon, PA                                                                                                                                    Past Surgical History Past Surgical History:  Procedure Laterality Date  . CHOLECYSTECTOMY N/A 10/01/2018   Procedure: LAPAROSCOPIC CHOLECYSTECTOMY WITH INTRAOPERATIVE CHOLANGIOGRAM;  Surgeon: Gaynelle Adu, MD;  Location: [facility] OR;  Service: General;  Laterality: N/A;  . GASTROSTOMY W/ FEEDING TUBE    . TRACHEOSTOMY    . vocal cord repair     Family History No family history on file.  Social History Social History   Tobacco Use  . Smoking status: Never  . Smokeless tobacco:  Never  Vaping Use  . Vaping Use: Never used  Substance Use Topics  . Alcohol use: No  . Drug use: Never   Allergies Patient has no known allergies.  Review of Systems Review of Systems All other systems are reviewed and are negative for acute change except as noted in the HPI  Physical Exam Vital Signs  I have reviewed the triage vital signs BP (!) 120/96 (BP Location: Right Arm)   Pulse 60   Temp 97.6 F (36.4 C) (Oral)   Resp 16   SpO2 100%   Physical Exam Vitals reviewed.  Constitutional:      General: He is not in acute distress.    Appearance: He is well-developed. He is not diaphoretic.  HENT:     Head: Normocephalic and atraumatic.     Right Ear: External ear normal.  Left Ear: External ear normal.     Nose: Nose normal.     Mouth/Throat:     Mouth: Mucous membranes are moist.  Eyes:     General: No scleral icterus.    Conjunctiva/sclera: Conjunctivae normal.  Neck:     Trachea: Phonation normal.   Cardiovascular:     Rate and Rhythm: Normal rate and regular rhythm.  Pulmonary:     Effort: Pulmonary effort is normal. No respiratory distress.     Breath sounds: No stridor.  Abdominal:     General: There is no distension.  Musculoskeletal:        General: Normal range of motion.     Cervical back: Normal range of motion. No rigidity. Muscular tenderness present. No spinous process tenderness.  Lymphadenopathy:     Cervical: No cervical adenopathy.  Neurological:     Mental Status: He is alert and oriented to person, place, and time.  Psychiatric:        Behavior: Behavior normal.    ED Results and Treatments Labs (all labs ordered are listed, but only abnormal results are displayed) Labs Reviewed  CBC WITH DIFFERENTIAL/PLATELET  BASIC METABOLIC PANEL                                                                                                                         EKG  EKG Interpretation  Date/Time:    Ventricular Rate:    PR  Interval:    QRS Duration:   QT Interval:    QTC Calculation:   R Axis:     Text Interpretation:          Radiology CT Soft Tissue Neck W Contrast  Result Date: 12/02/2020 CLINICAL DATA:  Swelling to left neck. Foreign body suspected. On and off neck pain for the past week EXAM: CT NECK WITH CONTRAST TECHNIQUE: Multidetector CT imaging of the neck was performed using the standard protocol following the bolus administration of intravenous contrast. CONTRAST:  18mL OMNIPAQUE IOHEXOL 300 MG/ML  SOLN COMPARISON:  None. FINDINGS: Pharynx and larynx: Changes of left-sided laryngeal window and cord augmentation. No evidence of mass or inflammation Salivary glands: No inflammation, mass, or stone. Thyroid: Normal. Lymph nodes: None enlarged or abnormal density. Vascular: Negative. Limited intracranial: Negative. Visualized orbits: Negative. Mastoids and visualized paranasal sinuses: Clear. Skeleton: No acute or aggressive process. Upper chest: Negative IMPRESSION: Benign postoperative neck.  No explanation for pain. Electronically Signed   By: Marnee Spring M.D.   On: 12/02/2020 04:14    Pertinent labs & imaging results that were available during my care of the patient were reviewed by me and considered in my medical decision making (see chart for details).  Medications Ordered in ED Medications  iohexol (OMNIPAQUE) 300 MG/ML solution 75 mL (75 mLs Intravenous Contrast Given 12/02/20 0350)  acetaminophen (TYLENOL) tablet 1,000 mg (1,000 mg Oral Given 12/02/20 0423)  methocarbamol (ROBAXIN) tablet 500 mg (500 mg Oral Given 12/02/20 0423)  Procedures Procedures  (including critical care time)  Medical Decision Making / ED Course I have reviewed the nursing notes for this encounter and the patient's prior records (if available in EHR or on provided paperwork).   [name] was evaluated in Emergency Department on 12/02/2020 for the symptoms described in the history of present illness. He was evaluated in the context of the global COVID-19 pandemic, which necessitated consideration that the patient might be at risk for infection with the SARS-CoV-2 virus that causes COVID-19. Institutional protocols and algorithms that pertain to the evaluation of patients at risk for COVID-19 are in a state of rapid change based on information released by regulatory bodies including the CDC and federal and state organizations. These policies and algorithms were followed during the patient's care in the ED.  CT negative for lymphadenopathy. Noted what seems like a seroma to the left neck/trapezius region. No evidence of infection. Rest of labs reassuring. More consistent with muscle strain.       Final Clinical Impression(s) / ED Diagnoses Final diagnoses:  Acute strain of neck muscle, initial encounter     The patient appears reasonably screened and/or stabilized for discharge and I doubt any other medical condition or other [facility] requiring further screening, evaluation, or treatment in the ED at this time prior to discharge. Safe for discharge with strict return precautions.  Disposition: Discharge  Condition: Good  I have discussed the results, Dx and Tx plan with the patient/family who expressed understanding and agree(s) with the plan. Discharge instructions discussed at length. The patient/family was given strict return precautions who verbalized understanding of the instructions. No further questions at time of discharge.    ED Discharge Orders          Ordered    methocarbamol (ROBAXIN) 500 MG tablet  2 times daily        12/02/20 0545              This chart was dictated using voice recognition software.  Despite best efforts to proofread,  errors can occur which can change the documentation meaning.    Nira Conn, MD 12/02/20  [phone]

## 2020-12-02 NOTE — ED Notes (Signed)
Patient provided with some water and graham crackers

## 2020-12-02 NOTE — Discharge Instructions (Addendum)
You may use over-the-counter Motrin (Ibuprofen), Acetaminophen (Tylenol), topical muscle creams such as SalonPas, Icy Hot, Bengay, etc. Please stretch, apply ice or heat (whichever helps), and have massage therapy for additional assistance.  

## 2020-12-02 NOTE — ED Notes (Signed)
Patient transported to CT 

## 2021-07-12 IMAGING — CT CT NECK W/ CM
4 of 6 series · 12 of 33 positions shown, 14 images · IV contrast (APPLIED)
Comparison: None.

CLINICAL DATA: Swelling to left neck. Foreign body suspected. On
and off neck pain for the past week

EXAM:
CT NECK WITH CONTRAST
TECHNIQUE: Multidetector CT imaging of the neck was performed using the
standard protocol following the bolus administration of intravenous
contrast.
CONTRAST:  75mL OMNIPAQUE IOHEXOL 300 MG/ML  SOLN

[Series 3: axial neck · axial · 0.46mm/px · z∈[-216,-146]mm · 2 of 106 slices shown, 3 images]
[im 36/106  soft-tissue]
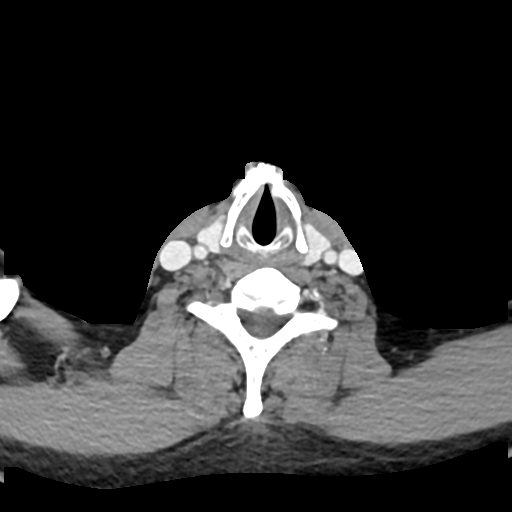
[im 36/106  bone]
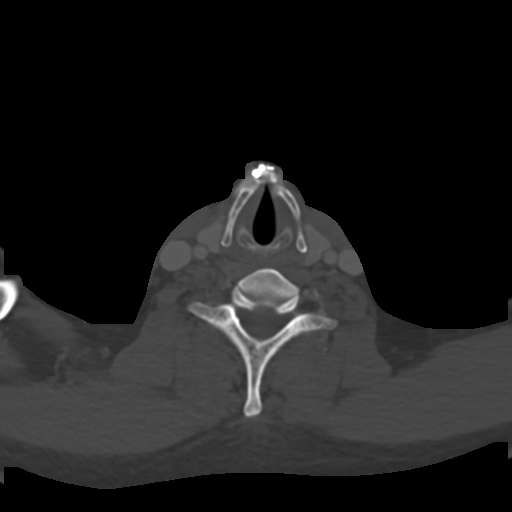
[im 71/106  bone]
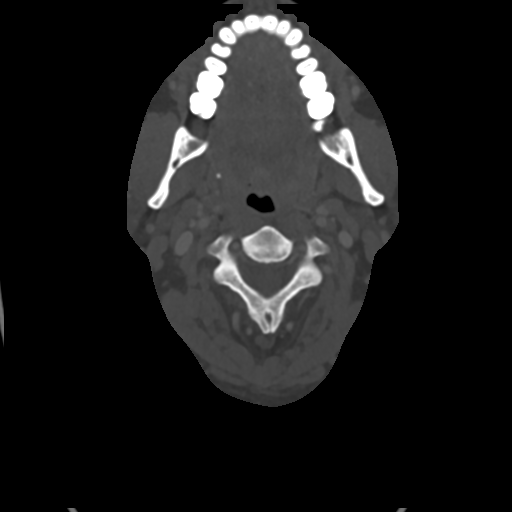

[Series 6: sag neck · sagittal · 0.41mm/px · 5 of 82 slices shown, 6 images]
[im 28/82  bone]
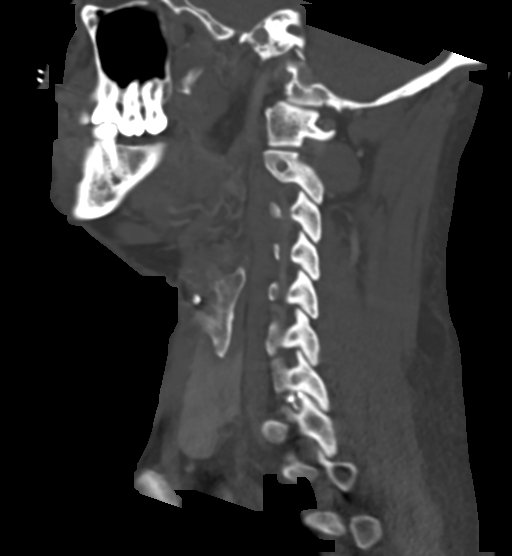
[im 34/82  bone]
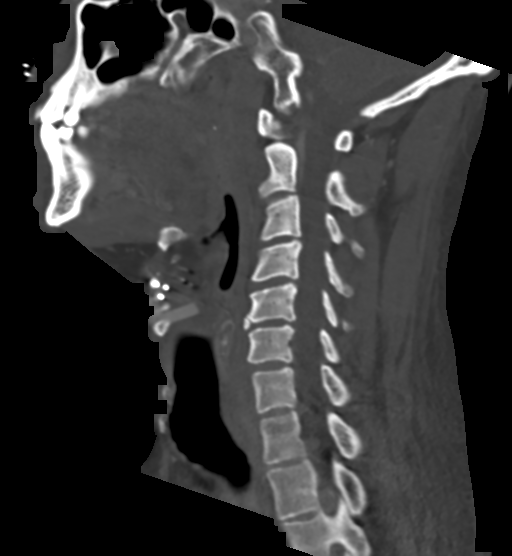
[im 41/82  soft-tissue]
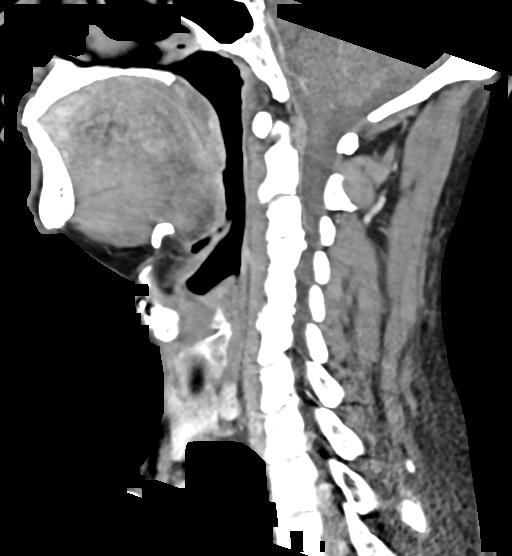
[im 41/82  bone]
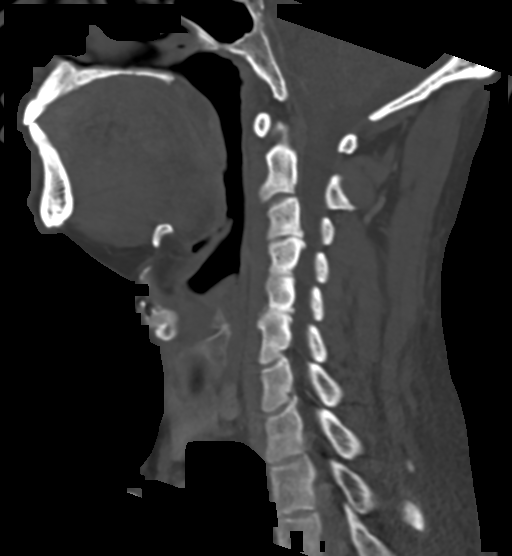
[im 48/82  bone]
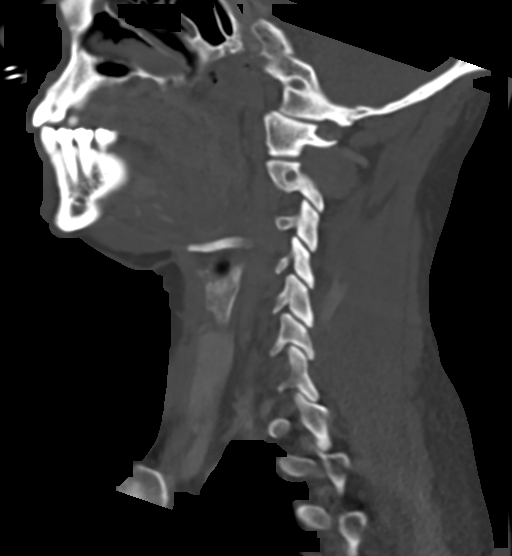
[im 55/82  bone]
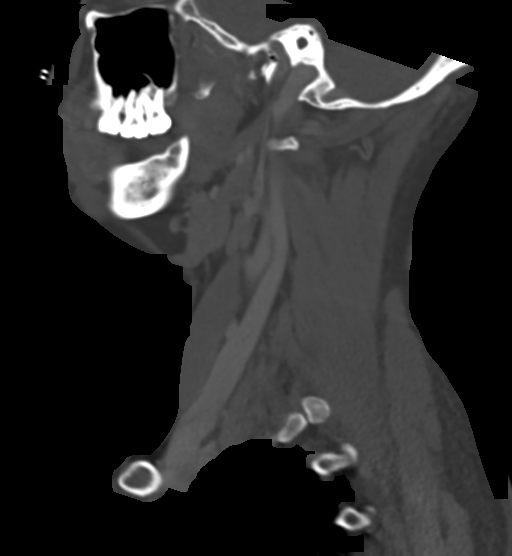

[Series 7: cor neck · coronal · 0.34mm/px · 3 of 103 slices shown]
[im 28/103  bone]
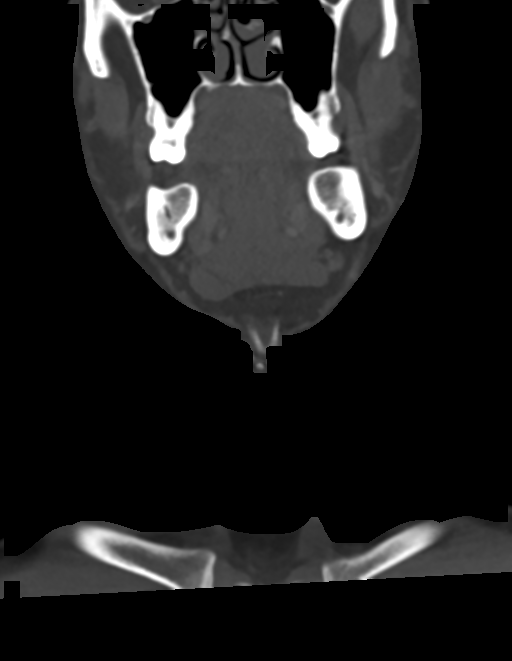
[im 44/103  bone]
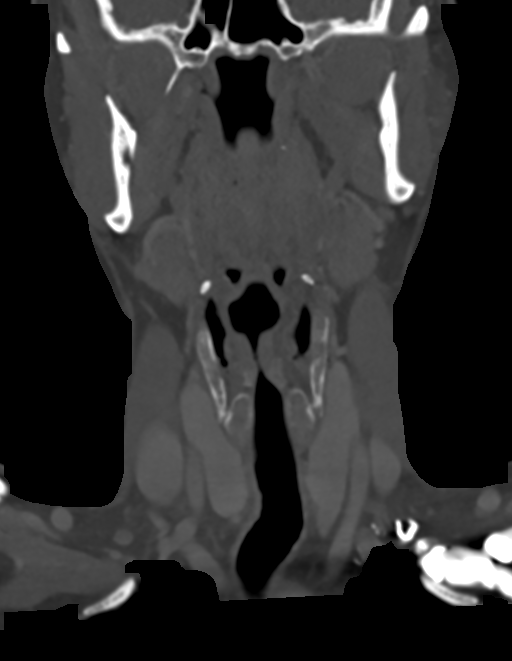
[im 59/103  bone]
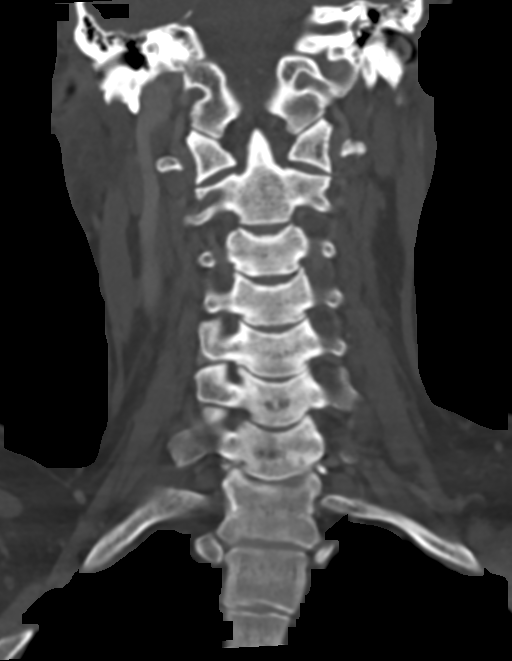

[Series 8: ax oropharynx · axial · 0.31mm/px · z∈[-245,-175]mm · 2 of 113 slices shown]
[im 38/113  bone]
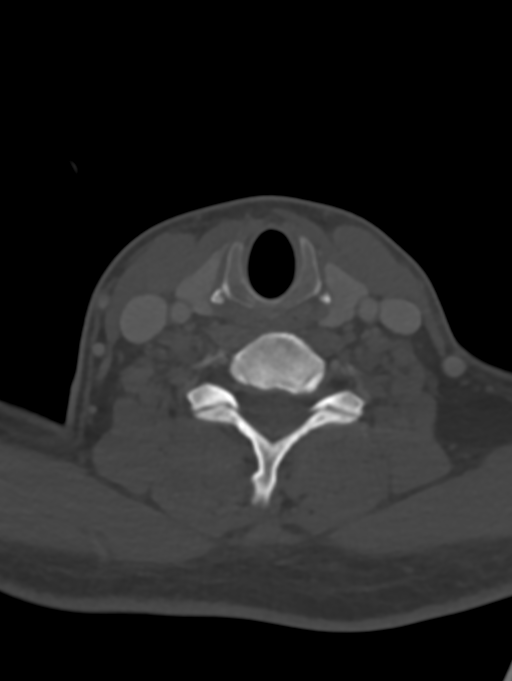
[im 75/113  bone]
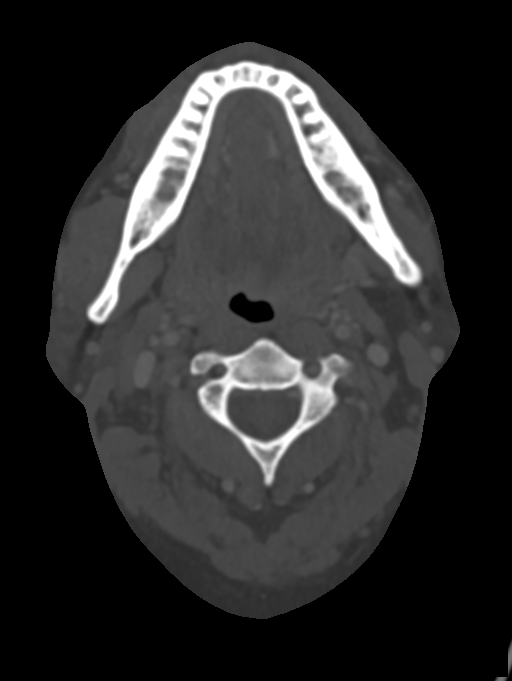

[12 of 33 positions shown; findings below may reference images not displayed]

FINDINGS: Pharynx and larynx: Changes of left-sided laryngeal window and cord
augmentation. No evidence of mass or inflammation

Salivary glands: No inflammation, mass, or stone.

Thyroid: Normal.

Lymph nodes: None enlarged or abnormal density.

Vascular: Negative.

Limited intracranial: Negative.

Visualized orbits: Negative.

Mastoids and visualized paranasal sinuses: Clear.

Skeleton: No acute or aggressive process.

Upper chest: Negative
IMPRESSION: Benign postoperative neck.  No explanation for pain.

## 2022-12-27 ENCOUNTER — Ambulatory Visit (INDEPENDENT_AMBULATORY_CARE_PROVIDER_SITE_OTHER): Payer: Medicaid Other

## 2022-12-27 ENCOUNTER — Ambulatory Visit
Admission: EM | Admit: 2022-12-27 | Discharge: 2022-12-27 | Disposition: A | Discharge status: Home / Self Care | Payer: Medicaid Other

## 2022-12-27 DIAGNOSIS — G8929 Other chronic pain: Secondary | ICD-10-CM

## 2022-12-27 DIAGNOSIS — M5441 Lumbago with sciatica, right side: Secondary | ICD-10-CM

## 2022-12-27 MED ORDER — PREDNISONE 20 MG PO TABS: ORAL_TABLET | ORAL | 0 refills | Status: DC

## 2022-12-27 MED ORDER — METHYLPREDNISOLONE ACETATE 80 MG/ML IJ SUSP
40.0000 mg | Freq: Once | INTRAMUSCULAR | Status: AC
  Administered 2022-12-27: 40 mg via INTRAMUSCULAR

## 2022-12-27 MED ORDER — BACLOFEN 5 MG PO TABS: 5.0000 mg | ORAL_TABLET | Freq: Three times a day (TID) | ORAL | 0 refills | Status: DC | PRN

## 2022-12-27 NOTE — ED Provider Notes (Signed)
[facility]  Note:  This document was prepared using Dragon voice recognition software and may include unintentional dictation errors.  MRN: [id number] DOB: Aug 29, 1970 DATE: 12/27/22   Subjective:  Chief Complaint:  Chief Complaint  Patient presents with   Back Pain     HPI: Cory Shannon is a 52 y.o. male presenting for right sided lower back pain with radiation into his right leg. Patient reports intermittently right sided low back pain for 6-7 months, but recently became worse within the past couple days. He states he has been lifting heavy loads. Pain with walking and weightbearing. Reports that he has not taken anything for the pain. Denies fever, nausea/vomiting, saddle paresthesia, numbness/tingling, urinary/bowel incontinence. Endorses low back pain. Presents NAD.  Prior to Admission medications   Medication Sig Start Date End Date Taking? Authorizing Provider  pantoprazole (PROTONIX) 40 MG tablet Take by mouth. 09/05/21  Yes [provider]     No Known Allergies  History:   History reviewed. No pertinent past medical history.   History reviewed. No pertinent surgical history.  History reviewed. No pertinent family history.  Social History   Tobacco Use   Smoking status: Never   Smokeless tobacco: Never  Vaping Use   Vaping Use: Never used    Review of Systems  Constitutional:  Positive for fever.  Gastrointestinal:  Negative for nausea and vomiting.  Genitourinary:  Negative for flank pain.  Musculoskeletal:  Positive for back pain.  Neurological:  Negative for numbness.     Objective:   Vitals: BP 115/72 (BP Location: Right Arm)   Pulse 86   Temp 98.7 F (37.1 C) (Oral)   Resp 18   SpO2 98%   Physical Exam Constitutional:      General: He is not in acute distress.    Appearance: Normal appearance. He is well-developed and normal weight. He is not ill-appearing or toxic-appearing.  HENT:     Head: Normocephalic and  atraumatic.  Cardiovascular:     Rate and Rhythm: Normal rate and regular rhythm.     Heart sounds: Normal heart sounds.  Pulmonary:     Effort: Pulmonary effort is normal.     Breath sounds: Normal breath sounds.     Comments: Clear to auscultation bilaterally  Abdominal:     General: Bowel sounds are normal.     Palpations: Abdomen is soft.     Tenderness: There is no abdominal tenderness. There is no right CVA tenderness or left CVA tenderness.  Musculoskeletal:     Lumbar back: No tenderness. Decreased range of motion.     Comments: Nontender to palpation. Decreased ROM due to pain with walking and weightbearing.   Skin:    General: Skin is warm and dry.  Neurological:     General: No focal deficit present.     Mental Status: He is alert.  Psychiatric:        Mood and Affect: Mood and affect normal.     Results:  Labs: No results found for this or any previous visit (from the past 24 hour(s)).  Radiology: DG Lumbar Spine Complete  Result Date: 12/27/2022 CLINICAL DATA:  Low back pain, right-sided sciatica EXAM: LUMBAR SPINE - COMPLETE 4+ VIEW COMPARISON:  None Available. FINDINGS: There is no evidence of lumbar spine fracture. Levocurvature of the lumbar spine. Preservation of the normal lumbar lordosis. Trace retrolisthesis of L5 on S1. Disc heights are relatively preserved. Lower lumbar facet arthropathy. IMPRESSION: Lower lumbar facet arthropathy with trace  retrolisthesis of L5 on S1. Electronically Signed   By: Wiliam Ke M.D.   On: 12/27/2022 15:02     UC Course/Treatments:  Procedures: Procedures   Medications Ordered in UC: Medications  methylPREDNISolone acetate (DEPO-MEDROL) injection 40 mg (has no administration in time range)     Assessment and Plan :     ICD-10-CM   1. Chronic right-sided low back pain with right-sided sciatica  M54.41    G89.29      Chronic right-sided low back pain with right-sided sciatica Afebrile, nontoxic-appearing, NAD.  VSS. DDX includes but not limited to: strain, arthritis, fracture, contusion, herniated disc Imaging was unremarkable except for trace retrolisthesis of L5 on S1. Patient was given Methylprednisolone 40mg  IM today in office for his pain. He was prescribed baclofen 3 times daily as needed as well as prednisone taper to continue at home. Recommend he start the oral prednisone tomorrow given his injection today in office.  I recommend he follow-up with his PCP for monitoring of the retrolisthesis. Strict ED precautions were given and patient verbalized understanding.  ED Discharge Orders          Ordered    Baclofen 5 MG TABS  3 times daily PRN        12/27/22 1507    predniSONE (DELTASONE) 20 MG tablet        12/27/22 1507             PDMP not reviewed this encounter.     Tove Wideman P, PA-C 12/27/22 1520

## 2022-12-27 NOTE — Discharge Instructions (Addendum)
Your x-rays showed broken bones or dislocations. They were sent to a radiologist for further evaluation and we will call you if the radiologist sees anything different.    You were also given a prescription for a muscle relaxer (Baclofen) take at bedtime when you are not driving or working because it may cause dizziness/drowsiness.  You were given an injection (methylprednisolone) for your back pain today. This is a steroid. I have also sent you home with oral steroids (Prednisone) start these tomorrow given your injection today in office.   Return in 2 to 3 days if no improvement. Your evaluation was not suggestive of any emergent condition requiring medical intervention at this time. However, our office is limited in the tests and imaging we can perform at our facility.  Therefore, it is very important for you to pay attention to any new symptoms or worsening of your current condition.   Please go directly to the Emergency Department immediately should you begin to feel worse in any way or have any of the following symptoms: bowel/urinary incontinence, numbness between your legs, fever new onset numbness/tingling.

## 2022-12-27 NOTE — ED Triage Notes (Signed)
Pt c/o right sided lower back pain starting 6-7 months ago but worse after last night.

## 2023-01-02 ENCOUNTER — Encounter: Payer: Self-pay | Admitting: Emergency Medicine

## 2023-01-02 ENCOUNTER — Ambulatory Visit
Admission: EM | Admit: 2023-01-02 | Discharge: 2023-01-02 | Disposition: A | Discharge status: Home / Self Care | Payer: Medicaid Other | Attending: Internal Medicine | Admitting: Internal Medicine

## 2023-01-02 DIAGNOSIS — R1013 Epigastric pain: Secondary | ICD-10-CM | POA: Diagnosis not present

## 2023-01-02 DIAGNOSIS — K047 Periapical abscess without sinus: Secondary | ICD-10-CM

## 2023-01-02 LAB — POCT URINALYSIS DIP (MANUAL ENTRY)
Bilirubin, UA: NEGATIVE
Blood, UA: NEGATIVE
Glucose, UA: NEGATIVE mg/dL
Ketones, POC UA: NEGATIVE mg/dL
Leukocytes, UA: NEGATIVE
Nitrite, UA: NEGATIVE
Protein Ur, POC: NEGATIVE mg/dL
Spec Grav, UA: >=1.03 — AB (ref 1.010–1.025)
Urobilinogen, UA: 1 E.U./dL
pH, UA: 6.5 (ref 5.0–8.0)

## 2023-01-02 MED ORDER — AMOXICILLIN-POT CLAVULANATE 875-125 MG PO TABS: 1.0000 | ORAL_TABLET | Freq: Two times a day (BID) | ORAL | 0 refills | Status: AC

## 2023-01-02 MED ORDER — OMEPRAZOLE 40 MG PO CPDR: 40.0000 mg | DELAYED_RELEASE_CAPSULE | Freq: Every day | ORAL | 0 refills | Status: DC

## 2023-01-02 NOTE — Discharge Instructions (Signed)
I recommend you stop the prednisone at this time if you have any left. I have sent a medication called Omeprazole to your pharmacy to help with your stomach pain. You will be called with the results of your blood work. I recommend you follow up with your PCP within the next few days if no improvement.  Please noted our office is limited in the tests and imaging we can perform at our facility. Your evaluation was not suggestive of any emergent condition requiring medical intervention at this time. However, some abdominal problems make take more time to appear. Therefore, it is very important for you to pay attention to any new symptoms or worsening of your current condition.   Please go directly to the Emergency Department immediately should you begin to feel worse in any way or have any of the following symptoms: increasing or different abdominal pain, persistent vomiting, inability to drink fluids, fevers, bloody bowel movements, or begin vomiting blood.  You were prescribed an antibiotic called Augmentin. This is often used to treat dental infections; however, this antibiotic will not take care of the dental problem alone. It is very important that you see a dentist as soon as possible. The infection will continue to reoccur if the problem is not fixed by a dentist. You should take a probiotic with your antibiotic.

## 2023-01-02 NOTE — ED Provider Notes (Signed)
[facility]  Note:  This document was prepared using Dragon voice recognition software and may include unintentional dictation errors.  MRN: [id number] DOB: March 09, 1971 DATE: 01/02/23   Subjective:  Chief Complaint:  Chief Complaint  Patient presents with   Abdominal Pain     HPI: Cory Shannon is a 52 y.o. male presenting for multiple complaints. He first presents for epigastric pain for the past 3 days. The pain never goes below his belly button. Reports pain is intermittent, but more persistent today. He has a history of GERD per his chart and states he takes Zantac daily. He recently was seen for sciatica and was prescribed prednisone for his symptoms. He states he has been taking the prednisone as directed and still has some left. No vomiting, but some nausea. Last BM was about 2-3 hours ago and was normal for him. Reports no blood in his food.   Patient is also presenting for possible dental infection. Reports pain on his right side upper jaw. States it is worse with eating and he is avoiding eating on that side. He has also noticed some swelling on that side of his jaw as well. Denies fever, vomiting, diarrhea. Endorses nausea, epigastric pain, dental pain. Presents NAD.  Prior to Admission medications   Medication Sig Start Date End Date Taking? Authorizing Provider  famotidine (PEPCID) 40 MG tablet Take 40 mg by mouth daily.   Yes [provider]  Baclofen 5 MG TABS Take 1 tablet (5 mg total) by mouth 3 (three) times daily as needed. 12/27/22   Hamsa Laurich P, PA-C     No Known Allergies  History:   History reviewed. No pertinent past medical history.   History reviewed. No pertinent surgical history.  History reviewed. No pertinent family history.  Social History   Tobacco Use   Smoking status: Never   Smokeless tobacco: Never  Vaping Use   Vaping Use: Never used    Review of Systems  Constitutional:  Negative for fever.  HENT:  Positive for  dental problem.   Gastrointestinal:  Positive for abdominal pain and nausea. Negative for blood in stool, diarrhea and vomiting.     Objective:   Vitals: BP 126/70 (BP Location: Right Arm)   Pulse 71   Temp 97.8 F (36.6 C) (Oral)   Resp 17   SpO2 98%   Physical Exam Constitutional:      General: He is not in acute distress.    Appearance: Normal appearance. He is well-developed and normal weight. He is not ill-appearing or toxic-appearing.  HENT:     Head: Normocephalic and atraumatic.     Mouth/Throat:     Dentition: Abnormal dentition. Dental tenderness present.      Comments: Right upper 1st bicuspid showing signs of decay and tooth breakdown.  Cardiovascular:     Rate and Rhythm: Normal rate and regular rhythm.     Heart sounds: Normal heart sounds.  Pulmonary:     Effort: Pulmonary effort is normal.     Breath sounds: Normal breath sounds.     Comments: Clear to auscultation bilaterally  Abdominal:     General: Bowel sounds are normal.     Palpations: Abdomen is soft.     Tenderness: There is no abdominal tenderness. There is no right CVA tenderness or left CVA tenderness.     Comments: No acute abdomen  Musculoskeletal:     Lumbar back: Normal.  Skin:    General: Skin is warm and dry.  Neurological:     General: No focal deficit present.     Mental Status: He is alert.  Psychiatric:        Mood and Affect: Mood and affect normal.     Results:  Labs: Results for orders placed or performed during the hospital encounter of 01/02/23 (from the past 24 hour(s))  POCT urinalysis dipstick     Status: Abnormal   Collection Time: 01/02/23  6:03 PM  Result Value Ref Range   Color, UA yellow yellow   Clarity, UA clear clear   Glucose, UA negative negative mg/dL   Bilirubin, UA negative negative   Ketones, POC UA negative negative mg/dL   Spec Grav, UA >=9.147 (A) 1.010 - 1.025   Blood, UA negative negative   pH, UA 6.5 5.0 - 8.0   Protein Ur, POC negative  negative mg/dL   Urobilinogen, UA 1.0 0.2 or 1.0 E.U./dL   Nitrite, UA Negative Negative   Leukocytes, UA Negative Negative    Radiology: No results found.   UC Course/Treatments:  Procedures: Procedures   Medications Ordered in UC: Medications - No data to display   Assessment and Plan :     ICD-10-CM   1. Epigastric pain  R10.13 CBC with Differential/Platelet    Lipase    Comprehensive metabolic panel    POCT urinalysis dipstick    CBC with Differential/Platelet    Lipase    Comprehensive metabolic panel    POCT urinalysis dipstick    2. Dental infection  K04.7      Epigastric pain Afebrile, nontoxic-appearing, NAD. VSS. DDX includes but not limited to: gastritis, GERD, pancreatitis, cholecystitis, pyelonephritis, viral gastritis UA was unremarkable today in office. Given recent steroid use and history of GERD, suspect gastritis. Patient instructed to stop any prednisone that he has left. Will add a PPI to his Zantac. Omeprazole 40mg  every day was prescribed. CBC, CMP, and lipase are pending to evaluate for leukocytosis, pancreatitis, and liver function. No acute abdomen noted on exam today. Recommend follow up with his PCP and/or GI if no improvement. Strict ED precautions were given and patient verbalized understanding.  Dental infection Afebrile, nontoxic-appearing, NAD. VSS. DDX includes but not limited to: dental abscess, dental decay, cavity On exam, right upper 1st bicuspid showing signs of decay. Augmentin 875mg  BID was prescribed. He was instructed to follow up with his dentist as soon as possible. Recommend he take ABX with food and probiotic given current GI upset. Strict ED precautions were given and patient verbalized understanding.   ED Discharge Orders          Ordered    omeprazole (PRILOSEC) 40 MG capsule  Daily        01/02/23 1801    amoxicillin-clavulanate (AUGMENTIN) 875-125 MG tablet  Every 12 hours        01/02/23 1801             PDMP  not reviewed this encounter.     Cynda Acres, PA-C 01/02/23 1805

## 2023-01-02 NOTE — ED Triage Notes (Signed)
Pt reports generalized abdominal pain x 3 days. Denies any n/v/d. Has not taken any medication for pain. LBM was about 2/3 hrs ago.

## 2023-01-03 ENCOUNTER — Ambulatory Visit
Admission: EM | Admit: 2023-01-03 | Discharge: 2023-01-03 | Disposition: A | Discharge status: Home / Self Care | Payer: Medicaid Other

## 2023-01-03 DIAGNOSIS — R1084 Generalized abdominal pain: Secondary | ICD-10-CM

## 2023-01-03 LAB — CBC WITH DIFFERENTIAL/PLATELET
Basophils Absolute: 0.1 10*3/uL (ref 0.0–0.2)
Basos: 1 %
EOS (ABSOLUTE): 0.3 10*3/uL (ref 0.0–0.4)
Eos: 3 %
Hematocrit: 45 % (ref 37.5–51.0)
Hemoglobin: 14.5 g/dL (ref 13.0–17.7)
Immature Grans (Abs): 0.5 10*3/uL — ABNORMAL HIGH (ref 0.0–0.1)
Immature Granulocytes: 4 %
Lymphocytes Absolute: 3.6 10*3/uL — ABNORMAL HIGH (ref 0.7–3.1)
Lymphs: 30 %
MCH: 29.2 pg (ref 26.6–33.0)
MCHC: 32.2 g/dL (ref 31.5–35.7)
MCV: 91 fL (ref 79–97)
Monocytes Absolute: 1 10*3/uL — ABNORMAL HIGH (ref 0.1–0.9)
Monocytes: 8 %
Neutrophils Absolute: 6.5 10*3/uL (ref 1.4–7.0)
Neutrophils: 54 %
Platelets: 299 10*3/uL (ref 150–450)
RBC: 4.96 x10E6/uL (ref 4.14–5.80)
RDW: 12 % (ref 11.6–15.4)
WBC: 12 10*3/uL — ABNORMAL HIGH (ref 3.4–10.8)

## 2023-01-03 LAB — LIPASE: Lipase: 27 U/L (ref 13–78)

## 2023-01-03 LAB — COMPREHENSIVE METABOLIC PANEL
ALT: 36 IU/L (ref 0–44)
AST: 24 IU/L (ref 0–40)
Albumin: 4.3 g/dL (ref 3.8–4.9)
Alkaline Phosphatase: 85 IU/L (ref 44–121)
BUN/Creatinine Ratio: 29 — ABNORMAL HIGH (ref 9–20)
BUN: 14 mg/dL (ref 6–24)
Bilirubin Total: 0.4 mg/dL (ref 0.0–1.2)
CO2: 24 mmol/L (ref 20–29)
Calcium: 9.1 mg/dL (ref 8.7–10.2)
Chloride: 101 mmol/L (ref 96–106)
Creatinine, Ser: 0.48 mg/dL — ABNORMAL LOW (ref 0.76–1.27)
Globulin, Total: 2.2 g/dL (ref 1.5–4.5)
Glucose: 104 mg/dL — ABNORMAL HIGH (ref 70–99)
Potassium: 4.4 mmol/L (ref 3.5–5.2)
Sodium: 139 mmol/L (ref 134–144)
Total Protein: 6.5 g/dL (ref 6.0–8.5)
eGFR: 123 mL/min/{1.73_m2} (ref >59)

## 2023-01-03 NOTE — ED Provider Notes (Addendum)
Patient complains of worsening mid abdomen pain 2 hours ago, patient was apparently seen yesterday here at this location for similar complaint.  Patient states he had a bowel movement at 1 PM today which was 3 hours prior to arrival and that his stool was like water.  Patient was moaning and writhing upon arrival, unable to sit still long enough for vitals to be obtained.  Patient was seen by provider, bowel sounds completely absent, abdomen distended, acutely tender to palpation patient, patient was in obvious acute distress.  Patient accompanied by family member who agrees to take him to the emergency room now.   Theadora Rama Scales, PA-C 01/03/23 1559    Theadora Rama Scales, PA-C 01/03/23 1601

## 2023-01-03 NOTE — ED Notes (Signed)
Patient is being discharged from the Urgent Care and sent to the Emergency Department via POV . Per L. Lequita Halt, PA-C, patient is in need of higher level of care due to provider concern. Patient is aware and verbalizes understanding of plan of care. There were no vitals filed for this visit.

## 2023-01-03 NOTE — ED Triage Notes (Signed)
Pt c/o mid abd pain that worsened approx 2 hrs ago. Reports last BM today at 1pm. States BM was "like water." Pt writhing in seat. VS deferred at this time. PA aware.

## 2023-02-21 ENCOUNTER — Encounter: Payer: Self-pay | Admitting: Internal Medicine

## 2023-02-21 ENCOUNTER — Ambulatory Visit
Admission: EM | Admit: 2023-02-21 | Discharge: 2023-02-21 | Disposition: A | Discharge status: Home / Self Care | Payer: Medicaid Other | Attending: Internal Medicine | Admitting: Internal Medicine

## 2023-02-21 ENCOUNTER — Other Ambulatory Visit: Payer: Self-pay

## 2023-02-21 DIAGNOSIS — R1084 Generalized abdominal pain: Secondary | ICD-10-CM

## 2023-02-21 DIAGNOSIS — U071 COVID-19: Secondary | ICD-10-CM | POA: Diagnosis not present

## 2023-02-21 DIAGNOSIS — R110 Nausea: Secondary | ICD-10-CM | POA: Diagnosis present

## 2023-02-21 DIAGNOSIS — J209 Acute bronchitis, unspecified: Secondary | ICD-10-CM

## 2023-02-21 HISTORY — DX: Personal history of peptic ulcer disease: Z87.11

## 2023-02-21 MED ORDER — BENZONATATE 200 MG PO CAPS: 200.0000 mg | ORAL_CAPSULE | Freq: Three times a day (TID) | ORAL | 0 refills | Status: DC | PRN

## 2023-02-21 MED ORDER — OMEPRAZOLE 40 MG PO CPDR: 40.0000 mg | DELAYED_RELEASE_CAPSULE | Freq: Every day | ORAL | 0 refills | Status: AC

## 2023-02-21 MED ORDER — ONDANSETRON HCL 4 MG PO TABS: 4.0000 mg | ORAL_TABLET | Freq: Three times a day (TID) | ORAL | 0 refills | Status: DC | PRN

## 2023-02-21 NOTE — ED Triage Notes (Signed)
Seen by provider

## 2023-02-21 NOTE — ED Provider Notes (Signed)
[facility]  Note:  This document was prepared using Dragon voice recognition software and may include unintentional dictation errors.  MRN: [id number] DOB: Nov 05, 1970 DATE: 02/21/23   Subjective:  Chief Complaint:  Chief Complaint  Patient presents with   Abdominal Pain    Intermittent abdominal pain. Reports improvement today. Some nausea, but no vomiting. Reports [place] sandwich with onions and mayo yesterday which is new for him.    Cough    Reports cough starting last night. Sick contact. Reports chills and feeling feverish     HPI: Cory Shannon is a 52 y.o. male presenting for generalized abdominal pain and cough for the past day. Patient first reports generalized abdominal pain starting yesterday. He reports some improvement throughout the day today. He has some slight abdominal pain now. He states he had a [place] sandwich with onions and mayo for the first time last night.  He also reports having Ensure for the first time yesterday as well. Patient has extensive GI history per his chart. He first started having GI issues in July 2024. It appears he was ultimately diagnosed with infectious gastroenteritis/colitis that was treated with Cipro/Flagyl. He has since had intermittent abdominal pain that is being managed by GI with dicyclomine and sucralfate. He has upcoming EGD next month. He is not currently taking omeprazole due to running out of his medication. He reports some nausea, but no vomiting. He reports 3-4 loose bowel movements today, but reports he has been taking a laxative like medication. Denies fever, vomiting, dysuria, bloody stools. Endorses generalized abdominal pain, vomiting.  Patient also reports chills and cough starting last night. He states several members of his household have had similar symptoms. He states he currently has the chills and feels that he maybe developing a fever. Denies fever, nasal congestion, sore throat. Reports dry cough, chills.  Presents NAD.  Prior to Admission medications   Medication Sig Start Date End Date Taking? Authorizing Provider  dicyclomine (BENTYL) 10 MG capsule Take 10 mg by mouth 4 (four) times daily -  before meals and at bedtime.   Yes [provider]  sucralfate (CARAFATE) 1 g tablet Take 1 g by mouth 4 (four) times daily -  with meals and at bedtime.   Yes [provider]  Baclofen 5 MG TABS Take 1 tablet (5 mg total) by mouth 3 (three) times daily as needed. Patient not taking: Reported on 02/21/2023 12/27/22   Arraya Buck P, PA-C  famotidine (PEPCID) 40 MG tablet Take 40 mg by mouth daily. Patient not taking: Reported on 02/21/2023    [provider]  omeprazole (PRILOSEC) 40 MG capsule Take 1 capsule (40 mg total) by mouth daily. Patient not taking: Reported on 02/21/2023 01/02/23 02/01/23  Shrihan Putt P, PA-C     No Known Allergies  History:   Past Medical History:  Diagnosis Date   History of stomach ulcers      History reviewed. No pertinent surgical history.  History reviewed. No pertinent family history.  Social History   Tobacco Use   Smoking status: Every Day    Types: Cigarettes   Smokeless tobacco: Never  Vaping Use   Vaping status: Never Used  Substance Use Topics   Alcohol use: Not Currently   Drug use: Never    Review of Systems  Constitutional:  Positive for chills. Negative for fever.  HENT:  Negative for congestion, rhinorrhea and sore throat.   Respiratory:  Positive for cough.   Gastrointestinal:  Positive  for abdominal pain, diarrhea and nausea. Negative for blood in stool and vomiting.  Genitourinary:  Negative for dysuria.  Musculoskeletal:  Positive for back pain.     Objective:   Vitals: BP 113/69 (BP Location: Right Arm)   Pulse 64   Temp 98.2 F (36.8 C) (Oral)   Resp 15   SpO2 94%   Physical Exam Constitutional:      General: He is not in acute distress.    Appearance: Normal appearance. He is  well-developed and normal weight. He is not ill-appearing or toxic-appearing.  HENT:     Head: Normocephalic and atraumatic.  Cardiovascular:     Rate and Rhythm: Normal rate and regular rhythm.     Heart sounds: Normal heart sounds.  Pulmonary:     Effort: Pulmonary effort is normal.     Breath sounds: Normal breath sounds.     Comments: Clear to auscultation bilaterally  Abdominal:     General: Bowel sounds are normal.     Palpations: Abdomen is soft.     Tenderness: There is no abdominal tenderness. There is no right CVA tenderness or left CVA tenderness.     Comments: No acute abdomen  Musculoskeletal:     Lumbar back: Normal.  Skin:    General: Skin is warm and dry.  Neurological:     General: No focal deficit present.     Mental Status: He is alert.  Psychiatric:        Mood and Affect: Mood and affect normal.     Results:  Labs: No results found for this or any previous visit (from the past 24 hour(s)).  Radiology: No results found.   UC Course/Treatments:  Procedures: Procedures   Medications Ordered in UC: Medications - No data to display   Assessment and Plan :     ICD-10-CM   1. Generalized abdominal pain  R10.84     2. Acute bronchitis, unspecified organism  J20.9 SARS CORONAVIRUS 2 (TAT 6-24 HRS) Anterior Nasal Swab    SARS CORONAVIRUS 2 (TAT 6-24 HRS) Anterior Nasal Swab    3. Nausea without vomiting  R11.0      Generalized abdominal pain Afebrile, nontoxic-appearing, NAD. VSS. DDX includes but not limited to: GERD, IBS, IBD, gastritis Appears to be ongoing issue with the patient. Improving today. He has upcoming EGD next month. Stressed the importance of follow up for EGD and follow up with his GI doctor. I recommend he continue with dicyclomine 10mg  as directed as well as the sucralfate 1g as directed. Omeprazole 40mg  every day was prescribed for suspected gastritis. Strict ED precautions were given and patient verbalized understanding  Acute  bronchitis, unspecified organism Afebrile, nontoxic-appearing, NAD. VSS. DDX includes but not limited to: flu, COVID, bronchitis, pneumonia Flu not ordered due to patient not wanting to wait. COVID is pending. Suspect viral etiology. Recommend symptomatic treatment with Tylenol as needed for aches and fevers. Benzonatate 200mg  TID PRN was prescribed for cough. Strict ED precautions were given and patient verbalized understanding.  Nausea without vomiting Afebrile, nontoxic-appearing, NAD. VSS. DDX includes but not limited to: GERD, IBS, IBD, gastritis, viral etiology, cholecystitis Appears to be ongoing issue with the patient. Improving today. He has upcoming EGD next month. Stressed the importance of follow up for EGD and follow up with his GI doctor. I recommend he continue with dicyclomine 10mg  as directed as well as the sucralfate 1g as directed. Zofran 4mg  every 8 hours PRN was prescribed for worsening nausea or vomiting.  Strict ED precautions were given and patient verbalized understanding  ED Discharge Orders          Ordered    omeprazole (PRILOSEC) 40 MG capsule  Daily        02/21/23 1658    ondansetron (ZOFRAN) 4 MG tablet  Every 8 hours PRN        02/21/23 1700    benzonatate (TESSALON) 200 MG capsule  3 times daily PRN        02/21/23 1700             PDMP not reviewed this encounter.     Cynda Acres, PA-C 02/21/23 1717

## 2023-02-21 NOTE — Discharge Instructions (Signed)
I have refilled your Omeprazole 40mg  every day to help with your stomach pain. I have also sent you a prescription for nausea medication. Please take as directed.  Please noted our office is limited in the tests and imaging we can perform at our facility. Your evaluation was not suggestive of any emergent condition requiring medical intervention at this time. However, some abdominal problems make take more time to appear. Therefore, it is very important for you to pay attention to any new symptoms or worsening of your current condition.   Please go directly to the Emergency Department immediately should you begin to feel worse in any way or have any of the following symptoms: increasing or different abdominal pain, persistent vomiting, inability to drink fluids, fevers, bloody bowel movements, or begin vomiting blood.   I recommend you follow up with your GI doctor as directed.   Your COVID test was sent to the lab for further testing. Someone from our office will call you with your results.Your symptoms appear consistent with a viral respiratory illness. Recommend symptomatic treatment with cough medicine that I sent to your pharmacy and Tylenol as needed for fevers/aches. If new or worsening symptoms such as presistent fevers, difficulty breathing, shortness of breath, or chest pain, go directly to the ER.

## 2023-02-22 LAB — SARS CORONAVIRUS 2 (TAT 6-24 HRS): SARS Coronavirus 2: POSITIVE — AB

## 2023-03-06 ENCOUNTER — Ambulatory Visit
Admission: EM | Admit: 2023-03-06 | Discharge: 2023-03-06 | Disposition: A | Discharge status: Home / Self Care | Payer: Medicaid Other | Attending: Emergency Medicine | Admitting: Emergency Medicine

## 2023-03-06 ENCOUNTER — Telehealth: Payer: Self-pay

## 2023-03-06 DIAGNOSIS — K2970 Gastritis, unspecified, without bleeding: Secondary | ICD-10-CM | POA: Diagnosis not present

## 2023-03-06 DIAGNOSIS — K5900 Constipation, unspecified: Secondary | ICD-10-CM

## 2023-03-06 DIAGNOSIS — K2990 Gastroduodenitis, unspecified, without bleeding: Secondary | ICD-10-CM

## 2023-03-06 MED ORDER — SUCRALFATE 1 GM/10ML PO SUSP: 1.0000 g | Freq: Three times a day (TID) | ORAL | 0 refills | Status: DC

## 2023-03-06 NOTE — Discharge Instructions (Signed)
For relief of constipation, please drink 1 bottle of magnesium citrate within 15 minutes.  Within 2 to 3 hours this will produce a significant bowel movement.  For relief of gastritis and upper abdominal pain, please drink 10 mL of Carafate 4 times daily, 30 minutes before each meal and 30 minutes before bedtime.  If your symptoms worsen despite these recommendations, it is very important to go back to the emergency room for further evaluation.  Thank you for visiting [facility] today.

## 2023-03-06 NOTE — ED Triage Notes (Signed)
Pt c/o abd pain x2 days. Was seen at [facility] ED yesterday for same. Was told CT and u/s were normal. Pt stated pain increased today after eating. Denies n/v, but endorses gas.

## 2023-03-06 NOTE — ED Provider Notes (Signed)
[facility]    CSN: [id number] Arrival date & time: 03/06/23  1926    HISTORY   Chief Complaint  Patient presents with   Abdominal Pain   HPI Cory Shannon is a pleasant, 52 y.o. male who presents to urgent care today. Patient complains of continued abdominal pain, has been seen multiple times at this location as well as the ED for similar complaints.  EMR reviewed, patient was seen yesterday at [facility] health emergency room and [place], abdominal ultrasound was unremarkable and CT scan of abdomen revealed mild edema and thickening of the pancreas local to the duodenum.  Blood work did not reveal any signs of acute pancreatitis.  Patient reports daily bowel movements but patient continues to complain of bloating, back pain and straining with bowel movements.  The history is provided by the patient.   Past Medical History:  Diagnosis Date   History of stomach ulcers    There are no problems to display for this patient.  History reviewed. No pertinent surgical history.  Home Medications    Prior to Admission medications   Medication Sig Start Date End Date Taking? Authorizing Provider  sucralfate (CARAFATE) 1 GM/10ML suspension Take 10 mLs (1 g total) by mouth 4 (four) times daily -  with meals and at bedtime. 03/06/23  Yes Theadora Rama Scales, PA-C  famotidine (PEPCID) 40 MG tablet Take 40 mg by mouth daily. Patient not taking: Reported on 02/21/2023    [provider]  omeprazole (PRILOSEC) 40 MG capsule Take 1 capsule (40 mg total) by mouth daily. 02/21/23 03/23/23  Hermanns, Ashlee P, PA-C  ondansetron (ZOFRAN) 4 MG tablet Take 1 tablet (4 mg total) by mouth every 8 (eight) hours as needed for nausea or vomiting. 02/21/23   Hermanns, Ashlee P, PA-C    Family History No family history on file. Social History Social History   Tobacco Use   Smoking status: Every Day    Types: Cigarettes   Smokeless tobacco: Never  Vaping Use   Vaping status: Never  Used  Substance Use Topics   Alcohol use: Not Currently   Drug use: Never   Allergies   Patient has no known allergies.  Review of Systems Review of Systems Pertinent findings revealed after performing a 14 point review of systems has been noted in the history of present illness.  Physical Exam Vital Signs BP (!) 158/91 (BP Location: Right Arm)   Pulse 75   Temp 98.1 F (36.7 C) (Oral)   Resp 20   SpO2 97%   No data found.  Physical Exam Vitals and nursing note reviewed.  Constitutional:      General: He is not in acute distress.    Appearance: Normal appearance. He is normal weight. He is not ill-appearing.  HENT:     Head: Normocephalic and atraumatic.  Eyes:     Extraocular Movements: Extraocular movements intact.     Conjunctiva/sclera: Conjunctivae normal.     Pupils: Pupils are equal, round, and reactive to light.  Cardiovascular:     Rate and Rhythm: Normal rate and regular rhythm.  Pulmonary:     Effort: Pulmonary effort is normal.     Breath sounds: Normal breath sounds.  Abdominal:     General: Bowel sounds are decreased. There is distension.     Tenderness: There is generalized abdominal tenderness.  Musculoskeletal:        General: Normal range of motion.     Cervical back: Normal range of motion  and neck supple.  Skin:    General: Skin is warm and dry.  Neurological:     General: No focal deficit present.     Mental Status: He is alert and oriented to person, place, and time. Mental status is at baseline.  Psychiatric:        Mood and Affect: Mood normal.        Behavior: Behavior normal.        Thought Content: Thought content normal.        Judgment: Judgment normal.     Visual Acuity Right Eye Distance:   Left Eye Distance:   Bilateral Distance:    Right Eye Near:   Left Eye Near:    Bilateral Near:     UC Couse / Diagnostics / Procedures:     Radiology No results found.  Procedures Procedures (including critical care  time) EKG  Pending results:  Labs Reviewed - No data to display  Medications Ordered in UC: Medications - No data to display  UC Diagnoses / Final Clinical Impressions(s)   I have reviewed the triage vital signs and the nursing notes.  Pertinent labs & imaging results that were available during my care of the patient were reviewed by me and considered in my medical decision making (see chart for details).    Final diagnoses:  Constipation, unspecified constipation type  Gastritis and gastroduodenitis   Patient advised to purchase a bottle of magnesium citrate, drink the entire bottle within 15 minutes to produce a large bowel movement.  At patient's request, prescription for Carafate was sent to the pharmacy because the provider at [facility] health did not do so.  Emergency precautions advised.   Please see discharge instructions below for details of plan of care as provided to patient. ED Prescriptions     Medication Sig Dispense Auth. Provider   sucralfate (CARAFATE) 1 GM/10ML suspension Take 10 mLs (1 g total) by mouth 4 (four) times daily -  with meals and at bedtime. 420 mL Theadora Rama Scales, PA-C      PDMP not reviewed this encounter.  Pending results:  Labs Reviewed - No data to display  Discharge Instructions:   Discharge Instructions      For relief of constipation, please drink 1 bottle of magnesium citrate within 15 minutes.  Within 2 to 3 hours this will produce a significant bowel movement.  For relief of gastritis and upper abdominal pain, please drink 10 mL of Carafate 4 times daily, 30 minutes before each meal and 30 minutes before bedtime.  If your symptoms worsen despite these recommendations, it is very important to go back to the emergency room for further evaluation.  Thank you for visiting [facility] today.    Disposition Upon Discharge:  Condition: stable for discharge home  Patient presented with an acute illness with  associated systemic symptoms and significant discomfort requiring urgent management. In my opinion, this is a condition that a prudent lay person (someone who possesses an average knowledge of health and medicine) may potentially expect to result in complications if not addressed urgently such as respiratory distress, impairment of bodily function or dysfunction of bodily organs.   Routine symptom specific, illness specific and/or disease specific instructions were discussed with the patient and/or caregiver at length.   As such, the patient has been evaluated and assessed, work-up was performed and treatment was provided in alignment with urgent care protocols and evidence based medicine.  Patient/parent/caregiver has been advised that the  patient may require follow up for further testing and treatment if the symptoms continue in spite of treatment, as clinically indicated and appropriate.  Patient/parent/caregiver has been advised to return to the [facility] or PCP if no better; to PCP or the Emergency Department if new signs and symptoms develop, or if the current signs or symptoms continue to change or worsen for further workup, evaluation and treatment as clinically indicated and appropriate  The patient will follow up with their current PCP if and as advised. If the patient does not currently have a PCP we will assist them in obtaining one.   The patient may need specialty follow up if the symptoms continue, in spite of conservative treatment and management, for further workup, evaluation, consultation and treatment as clinically indicated and appropriate.  Patient/parent/caregiver verbalized understanding and agreement of plan as discussed.  All questions were addressed during visit.  Please see discharge instructions below for further details of plan.  This office note has been dictated using Teaching laboratory technician.  Unfortunately, this method of dictation can sometimes lead to  typographical or grammatical errors.  I apologize for your inconvenience in advance if this occurs.  Please do not hesitate to reach out to me if clarification is needed.      Theadora Rama Scales, PA-C 03/06/23 2001

## 2023-09-29 ENCOUNTER — Other Ambulatory Visit: Payer: Self-pay

## 2023-09-29 ENCOUNTER — Ambulatory Visit
Admission: EM | Admit: 2023-09-29 | Discharge: 2023-09-29 | Disposition: A | Discharge status: Home / Self Care | Attending: Physician Assistant | Admitting: Physician Assistant

## 2023-09-29 DIAGNOSIS — R109 Unspecified abdominal pain: Secondary | ICD-10-CM | POA: Diagnosis not present

## 2023-09-29 DIAGNOSIS — R195 Other fecal abnormalities: Secondary | ICD-10-CM | POA: Diagnosis not present

## 2023-09-29 MED ORDER — DICYCLOMINE HCL 10 MG PO CAPS: 10.0000 mg | ORAL_CAPSULE | Freq: Three times a day (TID) | ORAL | 0 refills | Status: DC

## 2023-09-29 NOTE — ED Triage Notes (Addendum)
 Patient C/O diarrhea x 5 days after eating "bad food at a resturant. Patient deniers nausea or vomiting. C/O abdominal cramping.

## 2023-09-29 NOTE — ED Notes (Signed)
 Disregard Resp Rate entry of 46, incorrect entry. Resp rate 16

## 2023-09-29 NOTE — Discharge Instructions (Signed)
 Eat a bland diet and avoid spicy/acidic/fatty foods.  Make sure you are drinking plenty of fluid and avoid dairy.  Start dicyclomine before each meal and before bed to help with abdominal cramping.  Follow-up with your GI specialist if your symptoms do not resolve within a few days.  If anything worsens and you have severe abdominal pain, fever, blood in your stool, black stools, nausea/vomiting interfere with oral intake, weakness you need to go to the ER immediately

## 2023-09-29 NOTE — ED Provider Notes (Signed)
 Cory Shannon    CSN: [id number] Arrival date & time: 09/29/23  1520      History   Chief Complaint Chief Complaint  Patient presents with   Abdominal Pain    HPI Cory Shannon is a 53 y.o. male.   Patient presents today accompanied by his daughter who help provide history and some translation.  He reports a 4-day history of abdominal cramping with associated diarrhea which he describes as more frequent and looser bowel movements; denies watery bowel movement.  He is having approximately 3 bowel movements per 24 hours which is increased from his normal 1/day.  He denies any associated melena or hematochezia.  During cramping episodes pain is rated 5 on a 0-10 pain scale and is improved following a bowel movement.  He is eating and drinking normally.  His symptoms began after he ate at a new restaurant and wonders if this could have caused his symptoms.  He has a history of peptic ulcer disease and is followed by GI for possible pancreatic mass; had abdominal MRI that showed abnormality of the pancreas concerning for pancreatitis versus small mass September 2024 and underwent EGD October 2024 that was reassuring though GI recommended follow-up which she has not had. Denies any abdominal pain, unintentional weight loss, night sweats, fevers.  Denies previous abdominal surgery.  He has tried Pepto-Bismol for acute symptoms which has not provided any relief.  Denies any recent travel, antibiotic use, medication changes.  Denies any associated nausea or vomiting.    Past Medical History:  Diagnosis Date   History of stomach ulcers     There are no active problems to display for this patient.   History reviewed. No pertinent surgical history.     Home Medications    Prior to Admission medications   Medication Sig Start Date End Date Taking? Authorizing Provider  dicyclomine (BENTYL) 10 MG capsule Take 1 capsule (10 mg total) by mouth 4 (four) times daily -  before meals and  at bedtime. 09/29/23  Yes Tailynn Armetta, Noberto Retort, PA-C  omeprazole (PRILOSEC) 40 MG capsule Take 1 capsule (40 mg total) by mouth daily. 02/21/23 03/23/23  Hermanns, Ashlee P, PA-C  sucralfate (CARAFATE) 1 GM/10ML suspension Take 10 mLs (1 g total) by mouth 4 (four) times daily -  with meals and at bedtime. 03/06/23   Theadora Rama Scales, PA-C    Family History No family history on file.  Social History Social History   Tobacco Use   Smoking status: Every Day    Types: Cigarettes   Smokeless tobacco: Never  Vaping Use   Vaping status: Never Used  Substance Use Topics   Alcohol use: Not Currently   Drug use: Never     Allergies   Patient has no known allergies.   Review of Systems Review of Systems  Constitutional:  Positive for activity change. Negative for appetite change, diaphoresis, fatigue, fever and unexpected weight change.  Respiratory:  Negative for shortness of breath.   Cardiovascular:  Negative for chest pain.  Gastrointestinal:  Positive for abdominal pain (Intermittent cramping) and diarrhea. Negative for blood in stool, constipation, nausea and vomiting.  Neurological:  Negative for weakness.     Physical Exam Triage Vital Signs ED Triage Vitals  Encounter Vitals Group     BP 09/29/23 1530 117/72     Systolic BP Percentile --      Diastolic BP Percentile --      Pulse Rate 09/29/23 1530 79  Resp 09/29/23 1530 (!) 46     Temp 09/29/23 1530 97.6 F (36.4 C)     Temp Source 09/29/23 1530 Oral     SpO2 09/29/23 1530 95 %     Weight --      Height --      Head Circumference --      Peak Flow --      Pain Score 09/29/23 1531 5     Pain Loc --      Pain Education --      Exclude from Growth Chart --    No data found.  Updated Vital Signs BP 117/72 (BP Location: Right Arm)   Pulse 79   Temp 97.6 F (36.4 C) (Oral)   Resp 16   SpO2 95%   Visual Acuity Right Eye Distance:   Left Eye Distance:   Bilateral Distance:    Right Eye Near:   Left Eye  Near:    Bilateral Near:     Physical Exam Vitals reviewed.  Constitutional:      General: He is awake.     Appearance: Normal appearance. He is well-developed. He is not ill-appearing.     Comments: Very pleasant male appears stated age in no acute distress sitting comfortably exam room  HENT:     Head: Normocephalic and atraumatic.     Mouth/Throat:     Mouth: Mucous membranes are moist.     Pharynx: Uvula midline. No oropharyngeal exudate or posterior oropharyngeal erythema.  Cardiovascular:     Rate and Rhythm: Normal rate and regular rhythm.     Heart sounds: Normal heart sounds, S1 normal and S2 normal. No murmur heard. Pulmonary:     Effort: Pulmonary effort is normal.     Breath sounds: Normal breath sounds. No stridor. No wheezing, rhonchi or rales.     Comments: Clear to auscultation bilaterally Abdominal:     General: Bowel sounds are normal.     Palpations: Abdomen is soft.     Tenderness: There is no abdominal tenderness. There is no right CVA tenderness, left CVA tenderness, guarding or rebound.     Comments: Benign abdominal exam  Skin:    General: Skin is warm.     Coloration: Skin is not pale.  Neurological:     Mental Status: He is alert.  Psychiatric:        Behavior: Behavior is cooperative.      Shannon Treatments / Results  Labs (all labs ordered are listed, but only abnormal results are displayed) Labs Reviewed - No data to display  EKG   Radiology No results found.  Procedures Procedures (including critical care time)  Medications Ordered in Shannon Medications - No data to display  Initial Impression / Assessment and Plan / Shannon Course  I have reviewed the triage vital signs and the nursing notes.  Pertinent labs & imaging results that were available during my care of the patient were reviewed by me and considered in my medical decision making (see chart for details).     Patient is well-appearing, afebrile, nontoxic, nontachycardic.  Vital  signs and physical exam are reassuring with no indication for emergent evaluation or imaging.  Stool testing was deferred as patient has only been symptomatic for a few days and denies any alarm symptoms.  Discussed that if his symptoms persist we would consider stool testing at 10 to 14 days after symptom onset.  Will treat symptomatically with dicyclomine to help manage symptoms.  He was  encouraged to use dietary and lifestyle modification for additional symptom relief including eating a bland diet and pushing fluids.  We did discuss that he should follow-up with GI if his symptoms do not resolve but also for ongoing evaluation of previous imaging abnormalities.  He expresses understanding and will contact them to schedule an appointment.  We discussed that if anything worsens and he has increasing abdominal pain, fever, nausea, vomiting, melena, hematochezia he needs to be seen emergently.  Strict return precautions given.  All questions answered to his and daughter satisfaction.  Final Clinical Impressions(s) / Shannon Diagnoses   Final diagnoses:  Abdominal cramping  Loose stools     Discharge Instructions      Eat a bland diet and avoid spicy/acidic/fatty foods.  Make sure you are drinking plenty of fluid and avoid dairy.  Start dicyclomine before each meal and before bed to help with abdominal cramping.  Follow-up with your GI specialist if your symptoms do not resolve within a few days.  If anything worsens and you have severe abdominal pain, fever, blood in your stool, black stools, nausea/vomiting interfere with oral intake, weakness you need to go to the ER immediately    ED Prescriptions     Medication Sig Dispense Auth. Provider   dicyclomine (BENTYL) 10 MG capsule Take 1 capsule (10 mg total) by mouth 4 (four) times daily -  before meals and at bedtime. 28 capsule Joeleen Wortley K, PA-C      PDMP not reviewed this encounter.   Jeani Hawking, PA-C 09/29/23 1557

## 2024-04-13 ENCOUNTER — Ambulatory Visit
Admission: EM | Admit: 2024-04-13 | Discharge: 2024-04-13 | Disposition: A | Discharge status: Home / Self Care | Attending: Internal Medicine | Admitting: Internal Medicine

## 2024-04-13 ENCOUNTER — Encounter: Payer: Self-pay | Admitting: *Deleted

## 2024-04-13 DIAGNOSIS — J029 Acute pharyngitis, unspecified: Secondary | ICD-10-CM | POA: Diagnosis not present

## 2024-04-13 DIAGNOSIS — R051 Acute cough: Secondary | ICD-10-CM | POA: Diagnosis not present

## 2024-04-13 DIAGNOSIS — R0981 Nasal congestion: Secondary | ICD-10-CM | POA: Diagnosis not present

## 2024-04-13 LAB — POC COVID19/FLU A&B COMBO
Covid Antigen, POC: NEGATIVE
Influenza A Antigen, POC: NEGATIVE
Influenza B Antigen, POC: NEGATIVE

## 2024-04-13 LAB — POCT RAPID STREP A (OFFICE): Rapid Strep A Screen: NEGATIVE

## 2024-04-13 MED ORDER — ALBUTEROL SULFATE HFA 108 (90 BASE) MCG/ACT IN AERS: 2.0000 | INHALATION_SPRAY | Freq: Four times a day (QID) | RESPIRATORY_TRACT | 0 refills | Status: AC | PRN

## 2024-04-13 MED ORDER — PROMETHAZINE-DM 6.25-15 MG/5ML PO SYRP
5.0000 mL | ORAL_SOLUTION | Freq: Four times a day (QID) | ORAL | 0 refills | Status: AC | PRN
Start: 2024-04-13 — End: ?

## 2024-04-13 MED ORDER — FLUTICASONE PROPIONATE 50 MCG/ACT NA SUSP: 1.0000 | Freq: Every day | NASAL | 0 refills | Status: AC | PRN

## 2024-04-13 NOTE — ED Provider Notes (Signed)
 [facility]  Note:  This document was prepared using Dragon voice recognition software and may include unintentional dictation errors.  MRN: [id number] DOB: 1971/03/08 DATE: 04/13/24   Subjective:  Chief Complaint:  Chief Complaint  Patient presents with   Cough   Nasal Congestion   Sore Throat     HPI: Cory Shannon is a 53 y.o. male presenting for cough and congestion for the past 2 days. Patient states he started with runny nose, sneezing, and cough yesterday. He reports taking Dayquil with little relief. No known sick contacts. He reports a dry cough. He occasionally has felt short of breath, but reports not very much. Denies fever, nausea/vomiting, abdominal pain. Endorses cough, congestion, sore throat. Presents NAD.  Prior to Admission medications   Medication Sig Start Date End Date Taking? Authorizing Provider  omeprazole  (PRILOSEC) 40 MG capsule Take 1 capsule (40 mg total) by mouth daily. 02/21/23 03/23/23  Emmett Arntz P, PA-C     No Known Allergies  History:   Past Medical History:  Diagnosis Date   History of stomach ulcers      History reviewed. No pertinent surgical history.  History reviewed. No pertinent family history.  Social History   Tobacco Use   Smoking status: Every Day    Types: Cigarettes   Smokeless tobacco: Never  Vaping Use   Vaping status: Never Used  Substance Use Topics   Alcohol use: Yes    Comment: occassionally   Drug use: Never    Review of Systems  Constitutional:  Negative for fever.  HENT:  Positive for congestion, ear pain, rhinorrhea, sneezing and sore throat.   Respiratory:  Positive for cough.   Gastrointestinal:  Negative for abdominal pain, nausea and vomiting.     Objective:   Vitals: BP 119/80 (BP Location: Right Arm)   Pulse 92   Temp 98.7 F (37.1 C) (Oral)   Resp 16   Wt 155 lb (70.3 kg)   SpO2 94%   Physical Exam Constitutional:      General: He is not in acute distress.     Appearance: Normal appearance. He is well-developed. He is obese. He is not ill-appearing or toxic-appearing.  HENT:     Head: Normocephalic and atraumatic.     Right Ear: Tympanic membrane and ear canal normal.     Left Ear: Tympanic membrane and ear canal normal.     Nose: Rhinorrhea present. Rhinorrhea is clear.     Right Turbinates: Swollen.     Left Turbinates: Swollen.     Mouth/Throat:     Pharynx: Uvula midline. Posterior oropharyngeal erythema present. No pharyngeal swelling or oropharyngeal exudate.     Tonsils: No tonsillar exudate or tonsillar abscesses.  Cardiovascular:     Rate and Rhythm: Normal rate and regular rhythm.     Heart sounds: Normal heart sounds.  Pulmonary:     Effort: Pulmonary effort is normal.     Breath sounds: Normal breath sounds.     Comments: Clear to auscultation bilaterally  Abdominal:     General: Bowel sounds are normal.     Palpations: Abdomen is soft.     Tenderness: There is no abdominal tenderness.  Skin:    General: Skin is warm and dry.  Neurological:     General: No focal deficit present.     Mental Status: He is alert.  Psychiatric:        Mood and Affect: Mood and affect normal.     Results:  Labs: Results for orders placed or performed during the hospital encounter of 04/13/24 (from the past 24 hours)  POC Covid19/Flu A&B Antigen     Status: None   Collection Time: 04/13/24  1:14 PM  Result Value Ref Range   Influenza A Antigen, POC Negative Negative   Influenza B Antigen, POC Negative Negative   Covid Antigen, POC Negative Negative  POCT rapid strep A     Status: None   Collection Time: 04/13/24  1:14 PM  Result Value Ref Range   Rapid Strep A Screen Negative Negative    Radiology: No results found.   UC Course/Treatments:  Procedures: Procedures   Medications Ordered in UC: Medications - No data to display   Assessment and Plan :     ICD-10-CM   1. Acute cough  R05.1 POC Covid19/Flu A&B Antigen    POC  Covid19/Flu A&B Antigen    2. Nasal congestion  R09.81     3. Acute pharyngitis, unspecified etiology  J02.9 POCT rapid strep A    POCT rapid strep A     Acute cough Nasal congestion Afebrile, nontoxic-appearing, NAD. VSS. DDX includes but not limited to: COVID, flu, bronchitis, pneumonia, viral URI COVID and flu were negative. Suspect viral etiology. Promethazine-DM QID PRN was prescribed for cough. Encouraged rest and fluids. Flonase 1 spray each nostril every day PRN was prescribed for congestion and post nasal drip. An albuterol inhaler 2 puffs every 6 hours PRN was also prescribed for any shortness of breath or wheezing Strict ED precautions were given and patient verbalized understanding.  Acute pharyngitis, unspecified etiology Afebrile, nontoxic-appearing, NAD. VSS. DDX includes but not limited to: strep, mono, viral pharyngitis, post nasal drip Strep and COVID were negative. Suspect viral etiology. Flonase 1 spray each nostril every day PRN was prescribed for congestion and post nasal drip. Recommend salt water gargles and over-the-counter throat spray/lozenges. Strict ED precautions were given and patient verbalized understanding.  ED Discharge Orders          Ordered    fluticasone (FLONASE) 50 MCG/ACT nasal spray  Daily PRN        04/13/24 1308    promethazine-dextromethorphan (PROMETHAZINE-DM) 6.25-15 MG/5ML syrup  4 times daily PRN        04/13/24 1308             PDMP not reviewed this encounter.     Kyara Boxer P, PA-C 04/13/24 1321

## 2024-04-13 NOTE — ED Triage Notes (Signed)
 Patient states 2 days of cough, congestion, sore throat since yesterday.  Tried Nyquil with little relief.

## 2024-04-13 NOTE — Discharge Instructions (Addendum)
 Your flu, COVID, and strep tests were negative. Your symptoms appear consistent with a viral respiratory illness. Recommend symptomatic treatment with nasal spray (Flonase) for congestion and Tylenol/Ibuprofen as directed for fevers/aches.   I have also sent a cough medicine to your pharmacy. You should take this at bedtime when you are not working or driving because it can make you sleepy.  Finally, an inhaler was sent for any shortness of breath or wheezing.  Return in 3-4 days if no improvement. If new or worsening symptoms such as presistent fevers, difficulty breathing, shortness of breath, or chest pain, go directly to the ER.
